# Patient Record
Sex: Female | Born: 1960
Health system: Southern US, Community
[De-identification: ages and names within clinical notes are randomized; demographics above are authoritative.]

## PROBLEM LIST (undated history)

## (undated) DIAGNOSIS — R42 Dizziness and giddiness: Secondary | ICD-10-CM

## (undated) DIAGNOSIS — G629 Polyneuropathy, unspecified: Secondary | ICD-10-CM

## (undated) DIAGNOSIS — E079 Disorder of thyroid, unspecified: Secondary | ICD-10-CM

## (undated) DIAGNOSIS — E119 Type 2 diabetes mellitus without complications: Secondary | ICD-10-CM

## (undated) HISTORY — PX: TONSILLECTOMY: SUR1361

## (undated) HISTORY — PX: TUBAL LIGATION: SHX77

## (undated) HISTORY — PX: THYROIDECTOMY: SHX17

## (undated) HISTORY — PX: CATARACT EXTRACTION: SUR2

---

## 2002-03-26 ENCOUNTER — Encounter: Payer: Self-pay | Admitting: Emergency Medicine

## 2002-03-26 ENCOUNTER — Emergency Department (HOSPITAL_COMMUNITY): Admission: EM | Admit: 2002-03-26 | Discharge: 2002-03-26 | Payer: Self-pay | Admitting: Emergency Medicine

## 2015-04-02 ENCOUNTER — Emergency Department (HOSPITAL_BASED_OUTPATIENT_CLINIC_OR_DEPARTMENT_OTHER)
Admission: EM | Admit: 2015-04-02 | Discharge: 2015-04-03 | Disposition: A | Discharge status: Home / Self Care | Payer: Self-pay | Attending: Emergency Medicine | Admitting: Emergency Medicine

## 2015-04-02 ENCOUNTER — Encounter (HOSPITAL_BASED_OUTPATIENT_CLINIC_OR_DEPARTMENT_OTHER): Payer: Self-pay | Admitting: *Deleted

## 2015-04-02 DIAGNOSIS — H81399 Other peripheral vertigo, unspecified ear: Secondary | ICD-10-CM | POA: Insufficient documentation

## 2015-04-02 DIAGNOSIS — R1030 Lower abdominal pain, unspecified: Secondary | ICD-10-CM | POA: Insufficient documentation

## 2015-04-02 DIAGNOSIS — E119 Type 2 diabetes mellitus without complications: Secondary | ICD-10-CM | POA: Insufficient documentation

## 2015-04-02 DIAGNOSIS — Z794 Long term (current) use of insulin: Secondary | ICD-10-CM | POA: Insufficient documentation

## 2015-04-02 HISTORY — DX: Type 2 diabetes mellitus without complications: E11.9

## 2015-04-02 HISTORY — DX: Disorder of thyroid, unspecified: E07.9

## 2015-04-02 LAB — COMPREHENSIVE METABOLIC PANEL
ALBUMIN: 4.2 g/dL (ref 3.5–5.0)
ALK PHOS: 51 U/L (ref 38–126)
ALT: 18 U/L (ref 14–54)
ANION GAP: 7 (ref 5–15)
AST: 19 U/L (ref 15–41)
BILIRUBIN TOTAL: 0.5 mg/dL (ref 0.3–1.2)
BUN: 15 mg/dL (ref 6–20)
CALCIUM: 9.5 mg/dL (ref 8.9–10.3)
CO2: 27 mmol/L (ref 22–32)
CREATININE: 0.75 mg/dL (ref 0.44–1.00)
Chloride: 105 mmol/L (ref 101–111)
GFR calc Af Amer: >60 mL/min (ref >60)
GFR calc non Af Amer: >60 mL/min (ref >60)
GLUCOSE: 307 mg/dL — AB (ref 65–99)
Potassium: 4.1 mmol/L (ref 3.5–5.1)
Sodium: 139 mmol/L (ref 135–145)
TOTAL PROTEIN: 7 g/dL (ref 6.5–8.1)

## 2015-04-02 LAB — CBC
HCT: 41.4 % (ref 36.0–46.0)
Hemoglobin: 13.6 g/dL (ref 12.0–15.0)
MCH: 25.7 pg — AB (ref 26.0–34.0)
MCHC: 32.9 g/dL (ref 30.0–36.0)
MCV: 78.3 fL (ref 78.0–100.0)
PLATELETS: 273 10*3/uL (ref 150–400)
RBC: 5.29 MIL/uL — ABNORMAL HIGH (ref 3.87–5.11)
RDW: 12.5 % (ref 11.5–15.5)
WBC: 8.4 10*3/uL (ref 4.0–10.5)

## 2015-04-02 LAB — DIFFERENTIAL
BASOS ABS: 0 10*3/uL (ref 0.0–0.1)
BASOS PCT: 0 %
EOS ABS: 0 10*3/uL (ref 0.0–0.7)
EOS PCT: 0 %
Lymphocytes Relative: 15 %
Lymphs Abs: 1.3 10*3/uL (ref 0.7–4.0)
Monocytes Absolute: 0.3 10*3/uL (ref 0.1–1.0)
Monocytes Relative: 3 %
NEUTROS PCT: 82 %
Neutro Abs: 6.6 10*3/uL (ref 1.7–7.7)

## 2015-04-02 LAB — CBG MONITORING, ED: Glucose-Capillary: 296 mg/dL — ABNORMAL HIGH (ref 65–99)

## 2015-04-02 LAB — LIPASE, BLOOD: Lipase: 49 U/L (ref 11–51)

## 2015-04-02 MED ORDER — SODIUM CHLORIDE 0.9 % IV BOLUS (SEPSIS)
1000.0000 mL | Freq: Once | INTRAVENOUS | Status: AC
  Administered 2015-04-02: 1000 mL via INTRAVENOUS

## 2015-04-02 MED ORDER — ONDANSETRON HCL 4 MG/2ML IJ SOLN
4.0000 mg | Freq: Once | INTRAMUSCULAR | Status: AC | PRN
  Administered 2015-04-02: 4 mg via INTRAVENOUS
  Filled 2015-04-02: qty 2

## 2015-04-02 MED ORDER — DIAZEPAM 5 MG/ML IJ SOLN
5.0000 mg | Freq: Once | INTRAMUSCULAR | Status: AC
  Administered 2015-04-02: 5 mg via INTRAVENOUS
  Filled 2015-04-02: qty 2

## 2015-04-02 NOTE — ED Notes (Signed)
[facility]

## 2015-04-02 NOTE — ED Notes (Signed)
Pt alert, NAD, calm, resting with eyes closed, guarding movements, c/o dizziness, HA and abd pain, (denies: sob or nausea), speech clear, no dyspnea noted, [facility]

## 2015-04-02 NOTE — ED Provider Notes (Addendum)
CSN: [id number]     Arrival date & time 04/02/15  2214 History  By signing my name below, I, Doreatha Martin, attest that this documentation has been prepared under the direction and in the presence of Paula Libra, MD. Electronically Signed: Doreatha Martin, ED Scribe. 04/02/2015. 11:01 PM.    Chief Complaint  Patient presents with  . Abdominal Pain   The history is provided by the patient. No language interpreter was used.    HPI Comments: Tracy Colon is a 54 y.o. female with h/o DM who presents to the Emergency Department complaining of moderate "room spinning" dizziness onset 2 hours ago. This is associated with nausea and vomiting. The nausea and vomiting led abdominal pain. She indicates her pain is in the lower abdomen and not worse with palpation or movement. She rates her pain as a 9 out of 10 at its worst but it has improved since arrival. She was given Zofran IV with improvement in her nausea. She denies diarrhea, tinnitus.   Past Medical History  Diagnosis Date  . Diabetes mellitus without complication (HCC)   . Thyroid disease    Past Surgical History  Procedure Laterality Date  . Tonsillectomy    . Tubal ligation    . Thyroidectomy     History reviewed. No pertinent family history. Social History  Substance Use Topics  . Smoking status: Never Smoker   . Smokeless tobacco: None  . Alcohol Use: No   OB History    No data available     Review of Systems  All other systems reviewed and are negative.  Allergies  Naprosyn  Home Medications   Prior to Admission medications   Medication Sig Start Date End Date Taking? Authorizing Provider  insulin aspart (NOVOLOG) 100 UNIT/ML injection Inject into the skin 3 (three) times daily before meals.   Yes Historical Provider, MD  insulin detemir (LEVEMIR) 100 UNIT/ML injection Inject into the skin at bedtime.   Yes Historical Provider, MD  diazepam (VALIUM) 5 MG tablet Take 1 tablet (5 mg total) by mouth every 8 (eight) hours  as needed (for vertigo). 04/03/15   Rafael Salway, MD  ondansetron (ZOFRAN ODT) 8 MG disintegrating tablet Take 1 tablet (8 mg total) by mouth every 8 (eight) hours as needed for nausea or vomiting. 04/03/15   Jalayah Gutridge, MD   BP 122/64 mmHg  Pulse 88  Temp(Src) 98.3 F (36.8 C) (Oral)  Resp 18  Ht  (1.753 m)  Wt 173 lb (78.472 kg)  BMI 25.54 kg/m2  SpO2 100%   Physical Exam General: Well-developed, well-nourished female in no acute distress; appearance consistent with age of record HENT: normocephalic; atraumatic Eyes: pupils equal, round and reactive to light; extraocular muscles intact; no nystagmus.  Neck: supple Heart: regular rate and rhythm; no murmurs, rubs or gallops Lungs: clear to auscultation bilaterally Abdomen: soft; nondistended; nontender; no masses or hepatosplenomegaly; bowel sounds present Extremities: No deformity; full range of motion; pulses normal Neurologic: Awake, alert and oriented; motor function intact in all extremities and symmetric; no facial droop Skin: Warm and dry Psychiatric: Normal mood and affect ED Course  Procedures (including critical care time) DIAGNOSTIC STUDIES: Oxygen Saturation is 100% on RA, normal by my interpretation.    COORDINATION OF CARE: 10:59 PM Discussed treatment plan with pt at bedside and pt agreed to plan.   Labs Review I have personally reviewed and evaluated these lab results as part of my medical decision-making.  MDM   Nursing notes and  vitals signs, including pulse oximetry, reviewed.  Summary of this visit's results, reviewed by myself:  Labs:  Results for orders placed or performed during the hospital encounter of 04/02/15 (from the past 24 hour(s))  POC CBG, ED     Status: Abnormal   Collection Time: 04/02/15 10:37 PM  Result Value Ref Range   Glucose-Capillary 296 (H) 65 - 99 mg/dL  Lipase, blood     Status: None   Collection Time: 04/02/15 10:40 PM  Result Value Ref Range   Lipase 49 11 - 51  U/L  Comprehensive metabolic panel     Status: Abnormal   Collection Time: 04/02/15 10:40 PM  Result Value Ref Range   Sodium 139 135 - 145 mmol/L   Potassium 4.1 3.5 - 5.1 mmol/L   Chloride 105 101 - 111 mmol/L   CO2 27 22 - 32 mmol/L   Glucose, Bld 307 (H) 65 - 99 mg/dL   BUN 15 6 - 20 mg/dL   Creatinine, Ser 4.090.75 0.44 - 1.00 mg/dL   Calcium 9.5 8.9 - 81.110.3 mg/dL   Total Protein 7.0 6.5 - 8.1 g/dL   Albumin 4.2 3.5 - 5.0 g/dL   AST 19 15 - 41 U/L   ALT 18 14 - 54 U/L   Alkaline Phosphatase 51 38 - 126 U/L   Total Bilirubin 0.5 0.3 - 1.2 mg/dL   GFR calc non Af Amer >60 >60 mL/min   GFR calc Af Amer >60 >60 mL/min   Anion gap 7 5 - 15  CBC     Status: Abnormal   Collection Time: 04/02/15 10:40 PM  Result Value Ref Range   WBC 8.4 4.0 - 10.5 K/uL   RBC 5.29 (H) 3.87 - 5.11 MIL/uL   Hemoglobin 13.6 12.0 - 15.0 g/dL   HCT 91.441.4 78.236.0 - 95.646.0 %   MCV 78.3 78.0 - 100.0 fL   MCH 25.7 (L) 26.0 - 34.0 pg   MCHC 32.9 30.0 - 36.0 g/dL   RDW 21.312.5 08.611.5 - 57.815.5 %   Platelets 273 150 - 400 K/uL  Differential     Status: None   Collection Time: 04/02/15 10:40 PM  Result Value Ref Range   Neutrophils Relative % 82 %   Neutro Abs 6.6 1.7 - 7.7 K/uL   Lymphocytes Relative 15 %   Lymphs Abs 1.3 0.7 - 4.0 K/uL   Monocytes Relative 3 %   Monocytes Absolute 0.3 0.1 - 1.0 K/uL   Eosinophils Relative 0 %   Eosinophils Absolute 0.0 0.0 - 0.7 K/uL   Basophils Relative 0 %   Basophils Absolute 0.0 0.0 - 0.1 K/uL  Urinalysis, Routine w reflex microscopic ([facility])     Status: Abnormal   Collection Time: 04/03/15 12:00 AM  Result Value Ref Range   Color, Urine YELLOW YELLOW   APPearance CLOUDY (A) CLEAR   Specific Gravity, Urine 1.038 (H) 1.005 - 1.030   pH 7.5 5.0 - 8.0   Glucose, UA >1000 (A) NEGATIVE mg/dL   Hgb urine dipstick NEGATIVE NEGATIVE   Bilirubin Urine NEGATIVE NEGATIVE   Ketones, ur 40 (A) NEGATIVE mg/dL   Protein, ur NEGATIVE NEGATIVE mg/dL   Urobilinogen, UA 0.2 0.0 - 1.0  mg/dL   Nitrite NEGATIVE NEGATIVE   Leukocytes, UA NEGATIVE NEGATIVE  Urine microscopic-add on     Status: None   Collection Time: 04/03/15 12:00 AM  Result Value Ref Range   Squamous Epithelial / LPF RARE RARE   WBC, UA  0-2 <3 WBC/hpf   RBC / HPF 0-2 <3 RBC/hpf   Bacteria, UA RARE RARE   12:18 AM Vertigo significantly improved after Valium IV.  I personally performed the services described in this documentation, which was scribed in my presence. The recorded information has been reviewed and is accurate.   Paula Libra, MD 04/03/15 1610  Paula Libra, MD 04/03/15 9604

## 2015-04-02 NOTE — ED Notes (Signed)
Abdominal pain. Dizziness. Vomiting. Symptoms started 2 hours ago.

## 2015-04-03 LAB — URINALYSIS, ROUTINE W REFLEX MICROSCOPIC
Bilirubin Urine: NEGATIVE
Glucose, UA: >1000 mg/dL — AB
Hgb urine dipstick: NEGATIVE
Ketones, ur: 40 mg/dL — AB
Leukocytes, UA: NEGATIVE
Nitrite: NEGATIVE
Protein, ur: NEGATIVE mg/dL
Specific Gravity, Urine: 1.038 — ABNORMAL HIGH (ref 1.005–1.030)
Urobilinogen, UA: 0.2 mg/dL (ref 0.0–1.0)
pH: 7.5 (ref 5.0–8.0)

## 2015-04-03 LAB — URINE MICROSCOPIC-ADD ON

## 2015-04-03 MED ORDER — ONDANSETRON 8 MG PO TBDP: 8.0000 mg | ORAL_TABLET | Freq: Three times a day (TID) | ORAL | Status: DC | PRN

## 2015-04-03 MED ORDER — DIAZEPAM 5 MG PO TABS: 5.0000 mg | ORAL_TABLET | Freq: Three times a day (TID) | ORAL | Status: DC | PRN

## 2015-04-03 NOTE — ED Notes (Signed)
Denies questions or needs, VSS, alert, NAD, calm, interactive, speech clear, (denies pain, nausea or dizziness), "ready to go, feel better", given Rx x2, out in w/c with family x2, self transferred to w/c.

## 2015-09-03 ENCOUNTER — Encounter (HOSPITAL_BASED_OUTPATIENT_CLINIC_OR_DEPARTMENT_OTHER): Payer: Self-pay | Admitting: *Deleted

## 2015-09-03 ENCOUNTER — Emergency Department (HOSPITAL_BASED_OUTPATIENT_CLINIC_OR_DEPARTMENT_OTHER)
Admission: EM | Admit: 2015-09-03 | Discharge: 2015-09-03 | Disposition: A | Discharge status: Home / Self Care | Payer: Self-pay | Attending: Emergency Medicine | Admitting: Emergency Medicine

## 2015-09-03 ENCOUNTER — Emergency Department (HOSPITAL_BASED_OUTPATIENT_CLINIC_OR_DEPARTMENT_OTHER): Payer: Self-pay

## 2015-09-03 DIAGNOSIS — M79642 Pain in left hand: Secondary | ICD-10-CM | POA: Insufficient documentation

## 2015-09-03 DIAGNOSIS — M6281 Muscle weakness (generalized): Secondary | ICD-10-CM | POA: Insufficient documentation

## 2015-09-03 DIAGNOSIS — Z794 Long term (current) use of insulin: Secondary | ICD-10-CM | POA: Insufficient documentation

## 2015-09-03 DIAGNOSIS — R600 Localized edema: Secondary | ICD-10-CM

## 2015-09-03 DIAGNOSIS — E1065 Type 1 diabetes mellitus with hyperglycemia: Secondary | ICD-10-CM | POA: Insufficient documentation

## 2015-09-03 DIAGNOSIS — M79641 Pain in right hand: Secondary | ICD-10-CM

## 2015-09-03 DIAGNOSIS — R739 Hyperglycemia, unspecified: Secondary | ICD-10-CM

## 2015-09-03 HISTORY — DX: Dizziness and giddiness: R42

## 2015-09-03 LAB — BASIC METABOLIC PANEL
Anion gap: 7 (ref 5–15)
BUN: 11 mg/dL (ref 6–20)
CO2: 29 mmol/L (ref 22–32)
Calcium: 9.4 mg/dL (ref 8.9–10.3)
Chloride: 103 mmol/L (ref 101–111)
Creatinine, Ser: 0.65 mg/dL (ref 0.44–1.00)
GFR calc Af Amer: >60 mL/min (ref >60)
GLUCOSE: 265 mg/dL — AB (ref 65–99)
POTASSIUM: 4.1 mmol/L (ref 3.5–5.1)
Sodium: 139 mmol/L (ref 135–145)

## 2015-09-03 LAB — CBC WITH DIFFERENTIAL/PLATELET
BASOS ABS: 0 10*3/uL (ref 0.0–0.1)
Basophils Relative: 0 %
EOS PCT: 1 %
Eosinophils Absolute: 0.1 10*3/uL (ref 0.0–0.7)
HEMATOCRIT: 37.9 % (ref 36.0–46.0)
Hemoglobin: 12.2 g/dL (ref 12.0–15.0)
LYMPHS ABS: 1.9 10*3/uL (ref 0.7–4.0)
LYMPHS PCT: 30 %
MCH: 25.7 pg — AB (ref 26.0–34.0)
MCHC: 32.2 g/dL (ref 30.0–36.0)
MCV: 80 fL (ref 78.0–100.0)
MONO ABS: 0.4 10*3/uL (ref 0.1–1.0)
MONOS PCT: 6 %
Neutro Abs: 4 10*3/uL (ref 1.7–7.7)
Neutrophils Relative %: 63 %
PLATELETS: 245 10*3/uL (ref 150–400)
RBC: 4.74 MIL/uL (ref 3.87–5.11)
RDW: 12.8 % (ref 11.5–15.5)
WBC: 6.4 10*3/uL (ref 4.0–10.5)

## 2015-09-03 LAB — CBG MONITORING, ED: GLUCOSE-CAPILLARY: 242 mg/dL — AB (ref 65–99)

## 2015-09-03 MED ORDER — HYDROCHLOROTHIAZIDE 25 MG PO TABS: 25.0000 mg | ORAL_TABLET | Freq: Every day | ORAL | Status: DC

## 2015-09-03 MED FILL — HYDROCHLOROTHIAZIDE 25 MG T: 25 | 15 days supply | Qty: 15 | Fill #0

## 2015-09-03 NOTE — ED Notes (Signed)
C/o pain in hands and feet "for a while" which is worse over the last 2-3 weeks. Denies injury. Also c/o swelling in ankles and weakness in both hands

## 2015-09-03 NOTE — ED Provider Notes (Signed)
CSN: [id number]     Arrival date & time 09/03/15  1023 History   First MD Initiated Contact with Patient 09/03/15 1031     Chief Complaint  Patient presents with  . Hand Pain     (Consider location/radiation/quality/duration/timing/severity/associated sxs/prior Treatment) HPI Comments: Patient is a 55 year old female with history of type 1 diabetes. She presents for evaluation of discomfort in her feet and hands that has been going on for several weeks. She denies any injury or trauma. She reports some weakness when she attempts to open jars or perform other tasks with her hands. She also reports some discomfort in her feet and feels as though her ankles may be slightly swollen.  Patient is a 55 y.o. female presenting with hand pain. The history is provided by the patient.  Hand Pain This is a new problem. The problem occurs constantly. The problem has been gradually worsening. Pertinent negatives include no chest pain and no shortness of breath. Nothing aggravates the symptoms. Nothing relieves the symptoms.    Past Medical History  Diagnosis Date  . Diabetes mellitus without complication (HCC)   . Thyroid disease   . Vertigo    Past Surgical History  Procedure Laterality Date  . Tonsillectomy    . Tubal ligation    . Thyroidectomy     No family history on file. Social History  Substance Use Topics  . Smoking status: Never Smoker   . Smokeless tobacco: Never Used  . Alcohol Use: No   OB History    No data available     Review of Systems  Respiratory: Negative for shortness of breath.   Cardiovascular: Negative for chest pain.  All other systems reviewed and are negative.     Allergies  Naprosyn  Home Medications   Prior to Admission medications   Medication Sig Start Date End Date Taking? Authorizing Provider  insulin aspart (NOVOLOG) 100 UNIT/ML injection Inject into the skin 3 (three) times daily before meals.   Yes Historical Provider, MD  insulin detemir  (LEVEMIR) 100 UNIT/ML injection Inject 40 Units into the skin daily.    Yes Historical Provider, MD  diazepam (VALIUM) 5 MG tablet Take 1 tablet (5 mg total) by mouth every 8 (eight) hours as needed (for vertigo). 04/03/15   John Molpus, MD  ondansetron (ZOFRAN ODT) 8 MG disintegrating tablet Take 1 tablet (8 mg total) by mouth every 8 (eight) hours as needed for nausea or vomiting. 04/03/15   John Molpus, MD   BP 137/85 mmHg  Pulse 96  Temp(Src) 98.6 F (37 C) (Oral)  Resp 20  Ht  (1.778 m)  Wt 175 lb (79.379 kg)  BMI 25.11 kg/m2  SpO2 99% Physical Exam  Constitutional: She is oriented to person, place, and time. She appears well-developed and well-nourished. No distress.  HENT:  Head: Normocephalic and atraumatic.  Neck: Normal range of motion. Neck supple.  Cardiovascular: Normal rate and regular rhythm.  Exam reveals no gallop and no friction rub.   No murmur heard. Pulmonary/Chest: Effort normal and breath sounds normal. No respiratory distress. She has no wheezes.  Abdominal: Soft. Bowel sounds are normal. She exhibits no distension. There is no tenderness.  Musculoskeletal: Normal range of motion. She exhibits edema.  There is trace edema of the bilateral lower extremities.  Both hands and feet appear grossly normal. There is no swelling, warmth, or discoloration.  Neurological: She is alert and oriented to person, place, and time.  Skin: Skin is warm and  dry. She is not diaphoretic.  Nursing note and vitals reviewed.   ED Course  Procedures (including critical care time) Labs Review Labs Reviewed  CBG MONITORING, ED - Abnormal; Notable for the following:    Glucose-Capillary 242 (*)    All other components within normal limits  BASIC METABOLIC PANEL  CBC WITH DIFFERENTIAL/PLATELET    Imaging Review No results found. I have personally reviewed and evaluated these images and lab results as part of my medical decision-making.   EKG Interpretation None       MDM   Final diagnoses:  None    Patient is a 55 year old female with history of diabetes who presents with complaints of bilateral hand and foot pain. Her sugars appear to be poorly controlled and I suspect the symptoms may be related to the development of a peripheral neuropathy. She does have trace edema of her ankles which will be treated with hydrochlorothiazide. I will also adjust her insulin dose upward somewhat and have her watch her carbohydrate/sugar intake. Patient describes difficulty getting in to see her doctor for financial reasons.    Geoffery Lyonsouglas La Dibella, MD 09/03/15 1230

## 2015-09-03 NOTE — Discharge Instructions (Signed)
Increase your Levemir to 45 units subcutaneously once daily.  Keep a record of your blood sugars which you can take to your primary Dr. at your next appointment.   Hyperglycemia Hyperglycemia occurs when the glucose (sugar) in your blood is too high. Hyperglycemia can happen for many reasons, but it most often happens to people who do not know they have diabetes or are not managing their diabetes properly.  CAUSES  Whether you have diabetes or not, there are other causes of hyperglycemia. Hyperglycemia can occur when you have diabetes, but it can also occur in other situations that you might not be as aware of, such as: Diabetes  If you have diabetes and are having problems controlling your blood glucose, hyperglycemia could occur because of some of the following reasons:  Not following your meal plan.  Not taking your diabetes medications or not taking it properly.  Exercising less or doing less activity than you normally do.  Being sick. Pre-diabetes  This cannot be ignored. Before people develop Type 2 diabetes, they almost always have "pre-diabetes." This is when your blood glucose levels are higher than normal, but not yet high enough to be diagnosed as diabetes. Research has shown that some long-term damage to the body, especially the heart and circulatory system, may already be occurring during pre-diabetes. If you take action to manage your blood glucose when you have pre-diabetes, you may delay or prevent Type 2 diabetes from developing. Stress  If you have diabetes, you may be "diet" controlled or on oral medications or insulin to control your diabetes. However, you may find that your blood glucose is higher than usual in the hospital whether you have diabetes or not. This is often referred to as "stress hyperglycemia." Stress can elevate your blood glucose. This happens because of hormones put out by the body during times of stress. If stress has been the cause of your high blood  glucose, it can be followed regularly by your caregiver. That way he/she can make sure your hyperglycemia does not continue to get worse or progress to diabetes. Steroids  Steroids are medications that act on the infection fighting system (immune system) to block inflammation or infection. One side effect can be a rise in blood glucose. Most people can produce enough extra insulin to allow for this rise, but for those who cannot, steroids make blood glucose levels go even higher. It is not unusual for steroid treatments to "uncover" diabetes that is developing. It is not always possible to determine if the hyperglycemia will go away after the steroids are stopped. A special blood test called an A1c is sometimes done to determine if your blood glucose was elevated before the steroids were started. SYMPTOMS  Thirsty.  Frequent urination.  Dry mouth.  Blurred vision.  Tired or fatigue.  Weakness.  Sleepy.  Tingling in feet or leg. DIAGNOSIS  Diagnosis is made by monitoring blood glucose in one or all of the following ways:  A1c test. This is a chemical found in your blood.  Fingerstick blood glucose monitoring.  Laboratory results. TREATMENT  First, knowing the cause of the hyperglycemia is important before the hyperglycemia can be treated. Treatment may include, but is not be limited to:  Education.  Change or adjustment in medications.  Change or adjustment in meal plan.  Treatment for an illness, infection, etc.  More frequent blood glucose monitoring.  Change in exercise plan.  Decreasing or stopping steroids.  Lifestyle changes. HOME CARE INSTRUCTIONS   Test  your blood glucose as directed.  Exercise regularly. Your caregiver will give you instructions about exercise. Pre-diabetes or diabetes which comes on with stress is helped by exercising.  Eat wholesome, balanced meals. Eat often and at regular, fixed times. Your caregiver or nutritionist will give you a  meal plan to guide your sugar intake.  Being at an ideal weight is important. If needed, losing as little as 10 to 15 pounds may help improve blood glucose levels. SEEK MEDICAL CARE IF:   You have questions about medicine, activity, or diet.  You continue to have symptoms (problems such as increased thirst, urination, or weight gain). SEEK IMMEDIATE MEDICAL CARE IF:   You are vomiting or have diarrhea.  Your breath smells fruity.  You are breathing faster or slower.  You are very sleepy or incoherent.  You have numbness, tingling, or pain in your feet or hands.  You have chest pain.  Your symptoms get worse even though you have been following your caregiver's orders.  If you have any other questions or concerns.   This information is not intended to replace advice given to you by your health care provider. Make sure you discuss any questions you have with your health care provider.   Document Released: 11/22/2000 Document Revised: 08/21/2011 Document Reviewed: 02/02/2015 Elsevier Interactive Patient Education [employer]

## 2016-07-12 ENCOUNTER — Emergency Department (HOSPITAL_BASED_OUTPATIENT_CLINIC_OR_DEPARTMENT_OTHER)
Admission: EM | Admit: 2016-07-12 | Discharge: 2016-07-12 | Disposition: A | Discharge status: Home / Self Care | Payer: Self-pay | Attending: Emergency Medicine | Admitting: Emergency Medicine

## 2016-07-12 ENCOUNTER — Encounter (HOSPITAL_BASED_OUTPATIENT_CLINIC_OR_DEPARTMENT_OTHER): Payer: Self-pay

## 2016-07-12 DIAGNOSIS — M79671 Pain in right foot: Secondary | ICD-10-CM

## 2016-07-12 DIAGNOSIS — E119 Type 2 diabetes mellitus without complications: Secondary | ICD-10-CM | POA: Insufficient documentation

## 2016-07-12 DIAGNOSIS — B351 Tinea unguium: Secondary | ICD-10-CM | POA: Insufficient documentation

## 2016-07-12 DIAGNOSIS — M79672 Pain in left foot: Secondary | ICD-10-CM

## 2016-07-12 DIAGNOSIS — M79642 Pain in left hand: Secondary | ICD-10-CM

## 2016-07-12 DIAGNOSIS — Z794 Long term (current) use of insulin: Secondary | ICD-10-CM | POA: Insufficient documentation

## 2016-07-12 DIAGNOSIS — M79641 Pain in right hand: Secondary | ICD-10-CM

## 2016-07-12 LAB — CBG MONITORING, ED: Glucose-Capillary: 243 mg/dL — ABNORMAL HIGH (ref 65–99)

## 2016-07-12 MED ORDER — TERBINAFINE HCL 1 % EX CREA: 1.0000 "application " | TOPICAL_CREAM | Freq: Two times a day (BID) | CUTANEOUS | 0 refills | Status: AC

## 2016-07-12 MED ORDER — PREDNISONE 20 MG PO TABS: ORAL_TABLET | ORAL | 0 refills | Status: DC

## 2016-07-12 MED ORDER — TERBINAFINE HCL 1 % EX CREA
TOPICAL_CREAM | Freq: Every day | CUTANEOUS | Status: DC
  Filled 2016-07-12: qty 12

## 2016-07-12 MED FILL — predniSONE 20 MG TABS: 20 | 9 days supply | Qty: 12 | Fill #0

## 2016-07-12 NOTE — Discharge Instructions (Signed)
Take the prescribed medication as directed.  You will need to watch your sugar closely while taking prednisone because it can cause it to go up.  Watch your sugar and carb intake as well. Follow-up with your primary care doctor. Return to the ED for new or worsening symptoms.

## 2016-07-12 NOTE — ED Triage Notes (Addendum)
C/o pain to both hand and both feet x 1 month-right great toenail has turned black x 1 month-NAD-steady gait-states endocrinologist started her on gabapentin for possible neuropathy last year-stopped taking med because it did not relieve pain

## 2016-07-12 NOTE — ED Provider Notes (Signed)
[facility] Provider Note   CSN: [id number] Arrival date & time: 07/12/16  1446     History   Chief Complaint Chief Complaint  Patient presents with  . Hand Pain  . Foot Pain    HPI Tracy Colon is a 56 y.o. female.  The history is provided by the patient and medical records.    56 year old female with history of diabetes, thyroid disease, presenting to the ED for bilateral hand and foot pain. She reports this is been ongoing for several months. Her endocrinologist seems to think she has peripheral neuropathy. She tried taking gabapentin, however it made her too drowsy. States she has had some relief with prednisone in the past.  States pain is mostly aching.  She denies any numbness or weakness. No fever or chills. No redness or warmth of the joints.  Did not check her CBG today.  Patient also complains of discolored toenails. States the worst seems to be her right great toe, however other toenails of started turning red/black in color. States she has noticed some white crusting beneath her nails so she tried to clean this out in the shower.  Was concerned this was fungal but was unsure so wanted it checked.  Past Medical History:  Diagnosis Date  . Diabetes mellitus without complication (HCC)   . Thyroid disease   . Vertigo     There are no active problems to display for this patient.   Past Surgical History:  Procedure Laterality Date  . THYROIDECTOMY    . TONSILLECTOMY    . TUBAL LIGATION      OB History    No data available       Home Medications    Prior to Admission medications   Medication Sig Start Date End Date Taking? Authorizing Provider  insulin aspart (NOVOLOG) 100 UNIT/ML injection Inject into the skin 3 (three) times daily before meals.    Historical Provider, MD  insulin detemir (LEVEMIR) 100 UNIT/ML injection Inject 40 Units into the skin daily.     Historical Provider, MD    Family History No family history on file.  Social  History Social History  Substance Use Topics  . Smoking status: Never Smoker  . Smokeless tobacco: Never Used  . Alcohol use No     Allergies   Naprosyn [naproxen]   Review of Systems Review of Systems  Musculoskeletal: Positive for arthralgias.  All other systems reviewed and are negative.    Physical Exam Updated Vital Signs BP 157/96 (BP Location: Left Arm)   Pulse 83   Temp 97.5 F (36.4 C) (Oral)   Resp 18   SpO2 100%   Physical Exam  Constitutional: She is oriented to person, place, and time. She appears well-developed and well-nourished.  HENT:  Head: Normocephalic and atraumatic.  Mouth/Throat: Oropharynx is clear and moist.  Eyes: Conjunctivae and EOM are normal. Pupils are equal, round, and reactive to light.  Neck: Normal range of motion.  Cardiovascular: Normal rate, regular rhythm and normal heart sounds.   Pulmonary/Chest: Effort normal and breath sounds normal.  Abdominal: Soft. Bowel sounds are normal.  Musculoskeletal: Normal range of motion.  Joints of hands and feet normal in appearance without significant swelling, discoloration, or warmth to touch; does have evidence of trigger finger of right 4th digit (known) Discoloration of nearly all the toenails, worse of the right great toe and right little toe; there is evidence of fungal infection present; color changes are consistent with onychomycosis  Neurological:  She is alert and oriented to person, place, and time.  Skin: Skin is warm and dry.  Psychiatric: She has a normal mood and affect.  Nursing note and vitals reviewed.    ED Treatments / Results  Labs (all labs ordered are listed, but only abnormal results are displayed) Labs Reviewed  CBG MONITORING, ED    EKG  EKG Interpretation None       Radiology No results found.  Procedures Procedures (including critical care time)  Medications Ordered in ED Medications - No data to display   Initial Impression / Assessment and  Plan / ED Course  I have reviewed the triage vital signs and the nursing notes.  Pertinent labs & imaging results that were available during my care of the patient were reviewed by me and considered in my medical decision making (see chart for details).  56 year old female here with aching pain of her hands and feet. Has been seen here for previous past. She does have poorly controlled diabetes. There is no swelling, redness, discoloration, or warmth to touch of her joints. I do not suspect septic joint, acute fx, or dislocation.  She very likely does have some peripheral neuropathy which is causing her symptoms.  States has responded well to prednisone in the past.  Will prescribe short taper of this as well as some pain medication.  Patient will need to monitor sugar closely while taking prednisone.  CBG here 247.  Patient was also complaining of discolored toenails. Appears to have fungal infection present of the nails and color changes are consistent with onychomycosis.  Will start Lamisil cream for this.    Encouraged to follow-up closely with her PCP.  Discussed plan with patient, she acknowledged understanding and agreed with plan of care.  Return precautions given for new or worsening symptoms.  Final Clinical Impressions(s) / ED Diagnoses   Final diagnoses:  Pain in both hands  Foot pain, bilateral  Onychomycosis    New Prescriptions New Prescriptions   PREDNISONE (DELTASONE) 20 MG TABLET    Take 40 mg by mouth daily for 3 days, then 20mg  by mouth daily for 3 days, then 10mg  daily for 3 days   TERBINAFINE (LAMISIL AT) 1 % CREAM    Apply 1 application topically 2 (two) times daily.     Garlon Hatchet, PA-C 07/12/16 1610    Benjiman Core, MD 07/12/16 2329

## 2016-10-30 ENCOUNTER — Emergency Department (HOSPITAL_BASED_OUTPATIENT_CLINIC_OR_DEPARTMENT_OTHER)
Admission: EM | Admit: 2016-10-30 | Discharge: 2016-10-30 | Disposition: A | Discharge status: Home / Self Care | Payer: Self-pay | Attending: Physician Assistant | Admitting: Physician Assistant

## 2016-10-30 ENCOUNTER — Encounter (HOSPITAL_BASED_OUTPATIENT_CLINIC_OR_DEPARTMENT_OTHER): Payer: Self-pay | Admitting: Emergency Medicine

## 2016-10-30 DIAGNOSIS — S0592XA Unspecified injury of left eye and orbit, initial encounter: Secondary | ICD-10-CM

## 2016-10-30 DIAGNOSIS — W228XXA Striking against or struck by other objects, initial encounter: Secondary | ICD-10-CM | POA: Insufficient documentation

## 2016-10-30 DIAGNOSIS — Z794 Long term (current) use of insulin: Secondary | ICD-10-CM | POA: Insufficient documentation

## 2016-10-30 DIAGNOSIS — Y929 Unspecified place or not applicable: Secondary | ICD-10-CM | POA: Insufficient documentation

## 2016-10-30 DIAGNOSIS — Z79899 Other long term (current) drug therapy: Secondary | ICD-10-CM | POA: Insufficient documentation

## 2016-10-30 DIAGNOSIS — Y999 Unspecified external cause status: Secondary | ICD-10-CM | POA: Insufficient documentation

## 2016-10-30 DIAGNOSIS — S0502XA Injury of conjunctiva and corneal abrasion without foreign body, left eye, initial encounter: Secondary | ICD-10-CM | POA: Insufficient documentation

## 2016-10-30 DIAGNOSIS — Y939 Activity, unspecified: Secondary | ICD-10-CM | POA: Insufficient documentation

## 2016-10-30 DIAGNOSIS — E119 Type 2 diabetes mellitus without complications: Secondary | ICD-10-CM | POA: Insufficient documentation

## 2016-10-30 MED ORDER — FLUORESCEIN SODIUM 0.6 MG OP STRP
1.0000 | ORAL_STRIP | Freq: Once | OPHTHALMIC | Status: AC
  Administered 2016-10-30: 1 via OPHTHALMIC
  Filled 2016-10-30: qty 1

## 2016-10-30 MED ORDER — ERYTHROMYCIN 5 MG/GM OP OINT
TOPICAL_OINTMENT | Freq: Once | OPHTHALMIC | Status: AC
  Administered 2016-10-30: 1 via OPHTHALMIC
  Filled 2016-10-30: qty 3.5

## 2016-10-30 MED ORDER — TETRACAINE HCL 0.5 % OP SOLN
1.0000 [drp] | Freq: Once | OPHTHALMIC | Status: AC
  Administered 2016-10-30: 1 [drp] via OPHTHALMIC
  Filled 2016-10-30: qty 4

## 2016-10-30 NOTE — Discharge Instructions (Signed)
We will use antibiotic ointment every 4 hours. Please follow-up in the morning with ophthalmology. You should call first thing in the morning to try to get an appointment.

## 2016-10-30 NOTE — ED Provider Notes (Addendum)
[facility] Provider Note   CSN: [id number] Arrival date & time: 10/30/16  1629  By signing my name below, I, Tracy Colon, attest that this documentation has been prepared under the direction and in the presence of physician practitioner, Corlis LeakMackuen, Cindee Saltourteney Lyn, MD. Electronically Signed: Linna Darnerussell Colon, Scribe. 10/30/2016. 6:07 PM.  History   Chief Complaint Chief Complaint  Patient presents with  . Eye Injury   The history is provided by the patient. No language interpreter was used.    HPI Comments: Tracy Colon is a 56 y.o. female with PMHx including DM who presents to the Emergency Department for evaluation of a left eye injury sustained four days ago. She states she inadvertently "poked" her left eye with a broomstick handle. Patient endorses gradually worsening pain and redness to her left eye as well as some photophobia bilaterally. She reports a "sandpaper sensation" in her left eye with movement of the eye. No alleviating factors noted. Patient denies any foreign bodies in her left eye, vision changes, nausea, vomiting, or any other associated symptoms. She is followed by an eye specialist.  Past Medical History:  Diagnosis Date  . Diabetes mellitus without complication (HCC)   . Thyroid disease   . Vertigo     There are no active problems to display for this patient.   Past Surgical History:  Procedure Laterality Date  . THYROIDECTOMY    . TONSILLECTOMY    . TUBAL LIGATION      OB History    No data available       Home Medications    Prior to Admission medications   Medication Sig Start Date End Date Taking? Authorizing Provider  insulin aspart (NOVOLOG) 100 UNIT/ML injection Inject into the skin 3 (three) times daily before meals.   Yes [provider]  insulin detemir (LEVEMIR) 100 UNIT/ML injection Inject 40 Units into the skin daily.    Yes [provider]  predniSONE (DELTASONE) 20 MG tablet Take 40 mg by mouth daily  for 3 days, then 20mg  by mouth daily for 3 days, then 10mg  daily for 3 days 07/12/16   Garlon HatchetSanders, Lisa M, PA-C  terbinafine (LAMISIL AT) 1 % cream Apply 1 application topically 2 (two) times daily. 07/12/16   Garlon HatchetSanders, Lisa M, PA-C    Family History No family history on file.  Social History Social History  Substance Use Topics  . Smoking status: Never Smoker  . Smokeless tobacco: Never Used  . Alcohol use No     Allergies   Naprosyn [naproxen]   Review of Systems Review of Systems  Eyes: Positive for photophobia, pain (L) and redness (L). Negative for visual disturbance.  Gastrointestinal: Negative for nausea and vomiting.   Physical Exam Updated Vital Signs BP 134/77 (BP Location: Right Arm)   Pulse 99   Temp 99.7 F (37.6 C) (Oral)   Resp 18   Ht 5\' 9"  (1.753 m)   Wt 175 lb (79.4 kg)   SpO2 98%   BMI 25.84 kg/m   Physical Exam  Constitutional: She is oriented to person, place, and time. She appears well-developed and well-nourished. No distress.  HENT:  Head: Normocephalic and atraumatic.  Eyes: EOM are normal. Pupils are equal, round, and reactive to light. Left conjunctiva is injected.  Pain with light in either pupil.  Neck: Neck supple. No tracheal deviation present.  Cardiovascular: Normal rate.   Pulmonary/Chest: Effort normal. No respiratory distress.  Musculoskeletal: Normal range of motion.  Neurological: She is alert  and oriented to person, place, and time.  Skin: Skin is warm and dry.  Psychiatric: She has a normal mood and affect. Her behavior is normal.  Nursing note and vitals reviewed.  ED Treatments / Results  Labs (all labs ordered are listed, but only abnormal results are displayed) Labs Reviewed - No data to display  EKG  EKG Interpretation None       Radiology No results found.  Procedures Procedures (including critical care time)  DIAGNOSTIC STUDIES: Oxygen Saturation is 98% on RA, normal by my interpretation.     COORDINATION OF CARE: 6:05 PM Discussed treatment plan with pt at bedside and pt agreed to plan.  Medications Ordered in ED Medications - No data to display   Initial Impression / Assessment and Plan / ED Course  I have reviewed the triage vital signs and the nursing notes.  Pertinent labs & imaging results that were available during my care of the patient were reviewed by me and considered in my medical decision making (see chart for details).     I personally performed the services described in this documentation, which was scribed in my presence. The recorded information has been reviewed and is accurate.   Well-appearing 56 year old female presenting with  injury to left eye. Patient reports having 4-5 days ago. She said was mildly pain ful at first but has been getting worse. Patient has an ophthalmologist but has not been following up with him recently. Patient has pain to light in her eye. As well as conjunctival irritation. Concern about her traumatic iritis. Woods lamp shows no evidence of corneal abrasion or ulcer. We'll treat with erythro ointment until follow-up with ophthalmology tomorrow. Patient reports she can follow ophthalmologist in the morning.  7:20 PM Discussed with patient and with ophthalmology. 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 Tono-Pen not working correctly but given that this started after traumatic injury, acute angle glaucoma less likely.. Will have follow-up with ophthalmology in one to 4 days.  Final Clinical Impressions(s) / ED Diagnoses   Final diagnoses:  None    New Prescriptions New Prescriptions   No medications on file   I personally performed the services described in this documentation, which was scribed in my presence. The recorded information has been reviewed and is accurate.      Abelino Derrick, MD 10/30/16 1829    Abelino Derrick, MD 10/30/16 1610

## 2016-10-30 NOTE — ED Triage Notes (Signed)
Pt poked her L eye with the broom handle 3 days ago. Reports ongoing pain and light sensitivity.

## 2016-10-30 NOTE — ED Notes (Signed)
Pt verbalizes understanding of d/c instructions and denies any further needs at this time. 

## 2017-02-09 IMAGING — CR DG HAND COMPLETE 3+V*R*
3 series · 3 of 3 positions shown · non-contrast
Comparison: None.

CLINICAL DATA: Pain and numbness for 2 weeks.  No history of trauma

EXAM:
RIGHT HAND - COMPLETE 3+ VIEW

[x hand pa right]
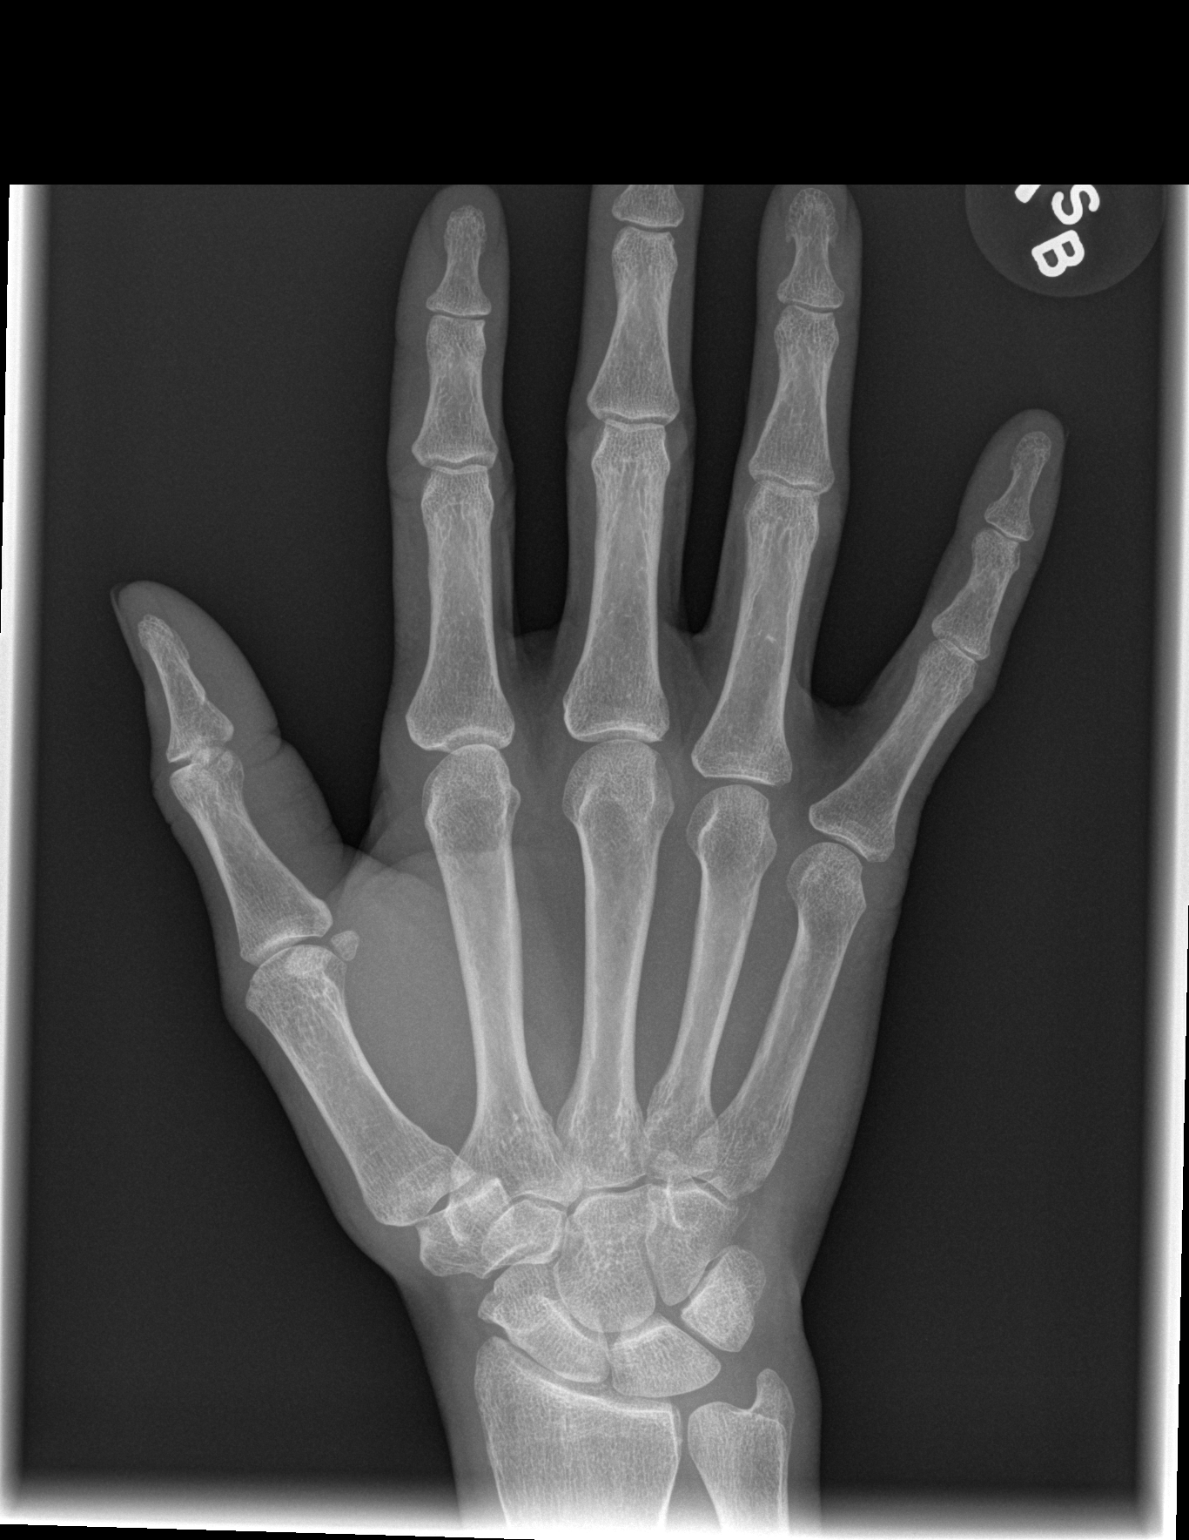

[x hand oblique right]
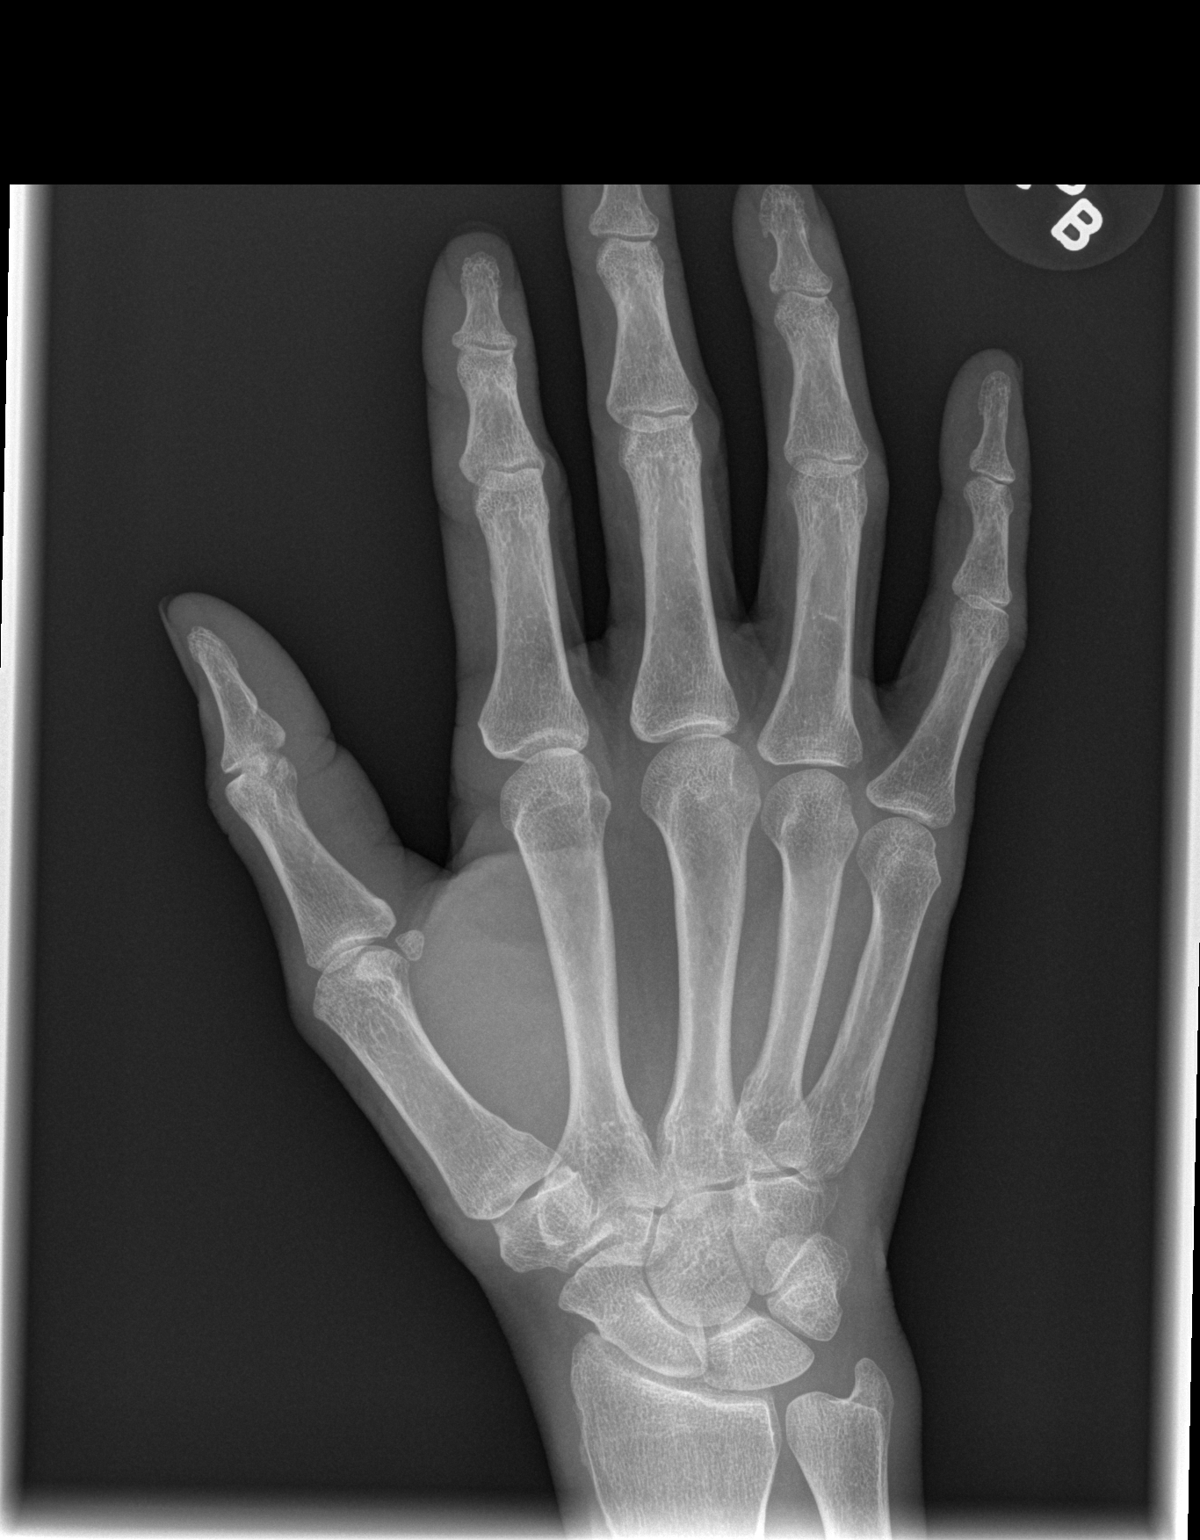

[x hand lat right]
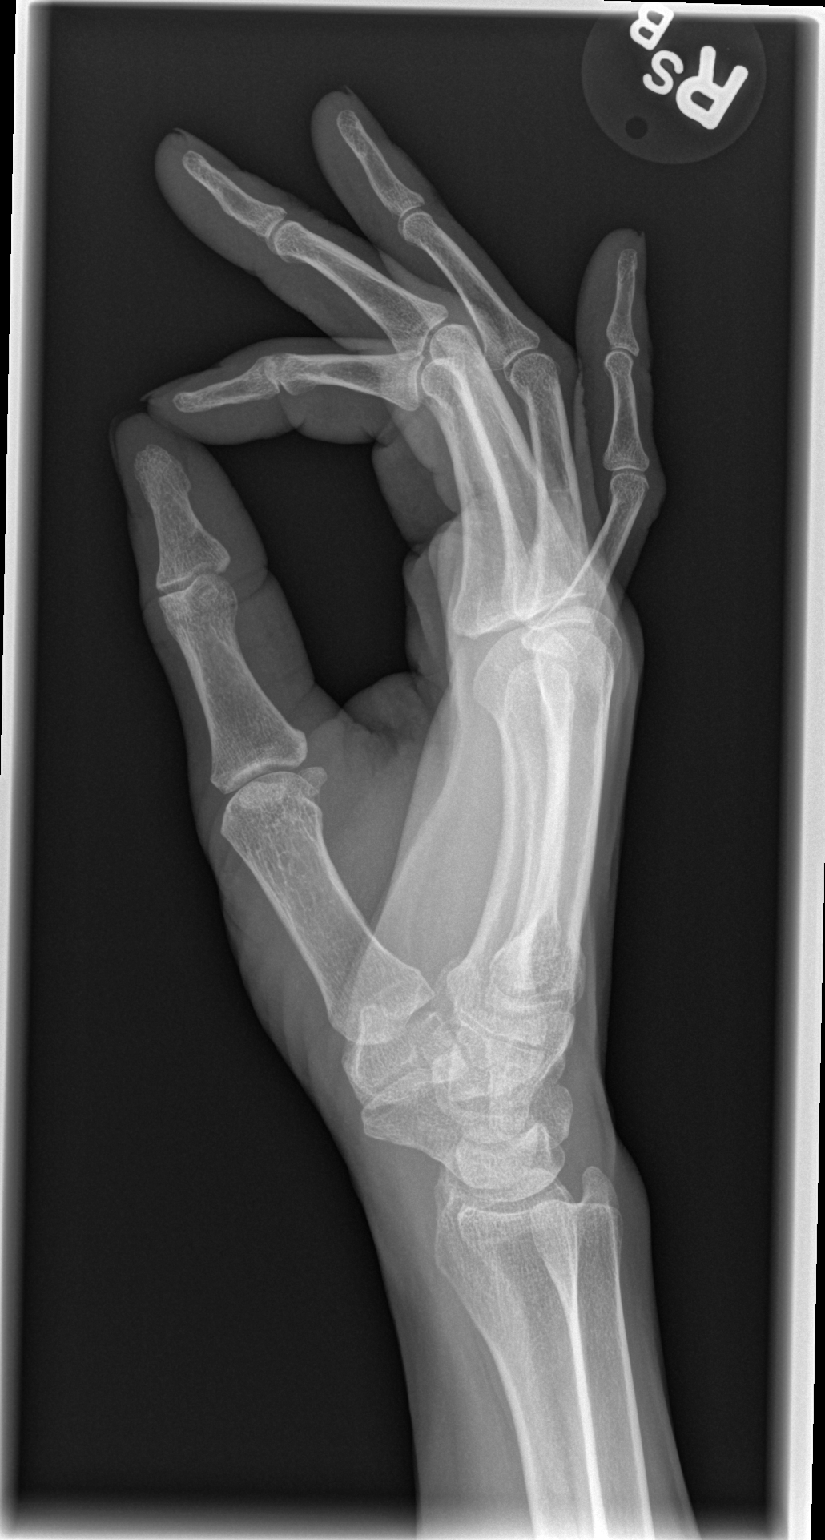

[3 of 3 positions shown; findings below may reference images not displayed]

FINDINGS: Frontal, oblique, and lateral views were obtained. There is no
fracture or dislocation. The joint spaces appear normal. No erosive
change.
IMPRESSION: No fracture or dislocation.  No appreciable arthropathy.

## 2017-07-14 ENCOUNTER — Emergency Department (HOSPITAL_BASED_OUTPATIENT_CLINIC_OR_DEPARTMENT_OTHER)
Admission: EM | Admit: 2017-07-14 | Discharge: 2017-07-14 | Disposition: A | Discharge status: Home / Self Care | Payer: Self-pay | Attending: Emergency Medicine | Admitting: Emergency Medicine

## 2017-07-14 ENCOUNTER — Encounter (HOSPITAL_BASED_OUTPATIENT_CLINIC_OR_DEPARTMENT_OTHER): Payer: Self-pay | Admitting: *Deleted

## 2017-07-14 ENCOUNTER — Other Ambulatory Visit: Payer: Self-pay

## 2017-07-14 DIAGNOSIS — Z794 Long term (current) use of insulin: Secondary | ICD-10-CM | POA: Insufficient documentation

## 2017-07-14 DIAGNOSIS — E119 Type 2 diabetes mellitus without complications: Secondary | ICD-10-CM | POA: Insufficient documentation

## 2017-07-14 DIAGNOSIS — T7840XA Allergy, unspecified, initial encounter: Secondary | ICD-10-CM

## 2017-07-14 MED ORDER — DIPHENHYDRAMINE HCL 50 MG/ML IJ SOLN
25.0000 mg | Freq: Once | INTRAMUSCULAR | Status: AC
  Administered 2017-07-14: 25 mg via INTRAMUSCULAR
  Filled 2017-07-14: qty 1

## 2017-07-14 MED ORDER — DEXAMETHASONE SODIUM PHOSPHATE 10 MG/ML IJ SOLN
10.0000 mg | Freq: Once | INTRAMUSCULAR | Status: AC
  Administered 2017-07-14: 10 mg via INTRAMUSCULAR
  Filled 2017-07-14: qty 1

## 2017-07-14 MED ORDER — FAMOTIDINE 20 MG PO TABS
20.0000 mg | ORAL_TABLET | Freq: Once | ORAL | Status: AC
  Administered 2017-07-14: 20 mg via ORAL
  Filled 2017-07-14: qty 1

## 2017-07-14 MED ORDER — PREDNISONE 20 MG PO TABS: ORAL_TABLET | ORAL | 0 refills | Status: AC

## 2017-07-14 MED ORDER — HYDROXYZINE HCL 25 MG PO TABS: 25.0000 mg | ORAL_TABLET | Freq: Four times a day (QID) | ORAL | 0 refills | Status: AC

## 2017-07-14 NOTE — ED Notes (Addendum)
Alert, NAD, calm, interactive, resps e/u, speaking in clear complete sentences, no dyspnea noted, skin W&D, VSS, c/o forehead and orbital burning and swelling, R eye swollen >L, (denies: airway throat or mouth sx, sob, nausea, or dizziness). EDP into room. Took benadryl PTA.

## 2017-07-14 NOTE — ED Provider Notes (Signed)
[facility] EMERGENCY DEPARTMENT Provider Note   CSN: [id number] Arrival date & time: 07/14/17  1813     History   Chief Complaint Chief Complaint  Patient presents with  . Allergic Reaction    HPI Tracy Colon is a 57 y.o. female.  Patient is a 57 year old female who presents with an allergic reaction.  She states that 2 days ago she used hair dye and she woke up this morning with swelling to her face.  She had a similar reaction about 10 years ago to hair dye.  She states she has not used hair dye since that time until 2 days ago.  She reports that she now has swelling around her eyes.  She denies any lip swelling.  She denies any airway swelling.  No shortness of breath.  No difficulty swallowing.  No change in her voice.  No wheezing.  No rash anywhere else.  She states her face is very itchy.  She took some Benadryl prior to arrival with no improvement in symptoms.      Past Medical History:  Diagnosis Date  . Diabetes mellitus without complication (HCC)   . Thyroid disease   . Vertigo     There are no active problems to display for this patient.   Past Surgical History:  Procedure Laterality Date  . THYROIDECTOMY    . TONSILLECTOMY    . TUBAL LIGATION      OB History    No data available       Home Medications    Prior to Admission medications   Medication Sig Start Date End Date Taking? Authorizing Provider  insulin aspart (NOVOLOG) 100 UNIT/ML injection Inject into the skin 3 (three) times daily before meals.   Yes [provider]  hydrOXYzine (ATARAX/VISTARIL) 25 MG tablet Take 1 tablet (25 mg total) by mouth every 6 (six) hours. 07/14/17   Rolan BuccoBelfi, Gene Colee, MD  insulin detemir (LEVEMIR) 100 UNIT/ML injection Inject 40 Units into the skin daily.     [provider]  predniSONE (DELTASONE) 20 MG tablet 1 tabs po daily x 4 days 07/14/17   Rolan BuccoBelfi, Lilianne Delair, MD  terbinafine (LAMISIL AT) 1 % cream Apply 1 application topically 2 (two)  times daily. 07/12/16   Garlon HatchetSanders, Lisa M, PA-C    Family History No family history on file.  Social History Social History   Tobacco Use  . Smoking status: Never Smoker  . Smokeless tobacco: Never Used  Substance Use Topics  . Alcohol use: No  . Drug use: No     Allergies   Naprosyn [naproxen]   Review of Systems Review of Systems  Constitutional: Negative for chills, diaphoresis, fatigue and fever.  HENT: Positive for facial swelling. Negative for congestion, rhinorrhea and sneezing.   Eyes: Negative.   Respiratory: Negative for cough, chest tightness and shortness of breath.   Cardiovascular: Negative for chest pain and leg swelling.  Gastrointestinal: Negative for abdominal pain, blood in stool, diarrhea, nausea and vomiting.  Genitourinary: Negative for difficulty urinating, flank pain, frequency and hematuria.  Musculoskeletal: Negative for arthralgias and back pain.  Skin: Positive for rash.  Neurological: Negative for dizziness, speech difficulty, weakness, numbness and headaches.     Physical Exam Updated Vital Signs BP (!) 115/91 (BP Location: Right Arm)   Pulse (!) 102   Temp 99.2 F (37.3 C) (Oral)   Resp 18   Ht 5\' 9"  (1.753 m)   Wt 80.7 kg (178 lb)   SpO2 98%  BMI 26.29 kg/m   Physical Exam  Constitutional: She is oriented to person, place, and time. She appears well-developed and well-nourished.  HENT:  Head: Normocephalic and atraumatic.  Patient has some swelling to her forehead and around her eyes.  There is no visible rash.  No vesicles.  No angioedema.  Eyes: Pupils are equal, round, and reactive to light.  Neck: Normal range of motion. Neck supple.  Cardiovascular: Normal rate, regular rhythm and normal heart sounds.  Pulmonary/Chest: Effort normal and breath sounds normal. No respiratory distress. She has no wheezes. She has no rales. She exhibits no tenderness.  Abdominal: Soft. Bowel sounds are normal. There is no tenderness. There is  no rebound and no guarding.  Musculoskeletal: Normal range of motion. She exhibits no edema.  Lymphadenopathy:    She has no cervical adenopathy.  Neurological: She is alert and oriented to person, place, and time.  Skin: Skin is warm and dry. No rash noted.  Psychiatric: She has a normal mood and affect.     ED Treatments / Results  Labs (all labs ordered are listed, but only abnormal results are displayed) Labs Reviewed - No data to display  EKG  EKG Interpretation None       Radiology No results found.  Procedures Procedures (including critical care time)  Medications Ordered in ED Medications  dexamethasone (DECADRON) injection 10 mg (10 mg Intramuscular Given 07/14/17 2213)  diphenhydrAMINE (BENADRYL) injection 25 mg (25 mg Intramuscular Given 07/14/17 2213)  famotidine (PEPCID) tablet 20 mg (20 mg Oral Given 07/14/17 2213)     Initial Impression / Assessment and Plan / ED Course  I have reviewed the triage vital signs and the nursing notes.  Pertinent labs & imaging results that were available during my care of the patient were reviewed by me and considered in my medical decision making (see chart for details).     Patient is a 57 year old female who presents with facial swelling related to an allergy to hair dye.  She has no airway involvement.  No angioedema.  The symptoms improved after treatment in the emergency department with Decadron, Benadryl and Pepcid.  She is otherwise well-appearing.  She was discharged home in good condition.  She was given prescriptions for prednisone and Atarax.  She was advised to keep an eye on her blood sugars as they can become elevated with steroids.  Return precautions were given.  Final Clinical Impressions(s) / ED Diagnoses   Final diagnoses:  Allergic reaction, initial encounter    ED Discharge Orders        Ordered    predniSONE (DELTASONE) 20 MG tablet     07/14/17 2302    hydrOXYzine (ATARAX/VISTARIL) 25 MG tablet   Every 6 hours     07/14/17 2302       Rolan Bucco, MD 07/14/17 2307

## 2017-07-14 NOTE — ED Notes (Signed)
Eye is more swollen now than upon arrival

## 2017-07-14 NOTE — ED Triage Notes (Signed)
Pt reports facial itching and swelling after having her hair colored yesterday. Resp even and unlabored. NAD

## 2019-05-05 ENCOUNTER — Other Ambulatory Visit: Payer: Self-pay

## 2019-05-05 ENCOUNTER — Emergency Department (HOSPITAL_BASED_OUTPATIENT_CLINIC_OR_DEPARTMENT_OTHER)
Admission: EM | Admit: 2019-05-05 | Discharge: 2019-05-06 | Disposition: A | Discharge status: Home / Self Care | Payer: Self-pay | Attending: Emergency Medicine | Admitting: Emergency Medicine

## 2019-05-05 ENCOUNTER — Encounter (HOSPITAL_BASED_OUTPATIENT_CLINIC_OR_DEPARTMENT_OTHER): Payer: Self-pay | Admitting: *Deleted

## 2019-05-05 DIAGNOSIS — H409 Unspecified glaucoma: Secondary | ICD-10-CM | POA: Insufficient documentation

## 2019-05-05 DIAGNOSIS — H5711 Ocular pain, right eye: Secondary | ICD-10-CM

## 2019-05-05 DIAGNOSIS — Z794 Long term (current) use of insulin: Secondary | ICD-10-CM | POA: Insufficient documentation

## 2019-05-05 DIAGNOSIS — E119 Type 2 diabetes mellitus without complications: Secondary | ICD-10-CM | POA: Insufficient documentation

## 2019-05-05 DIAGNOSIS — Z79899 Other long term (current) drug therapy: Secondary | ICD-10-CM | POA: Insufficient documentation

## 2019-05-05 DIAGNOSIS — H538 Other visual disturbances: Secondary | ICD-10-CM

## 2019-05-05 MED ORDER — TIMOLOL MALEATE 0.5 % OP SOLN
1.0000 [drp] | Freq: Once | OPHTHALMIC | Status: AC
  Administered 2019-05-05: 1 [drp] via OPHTHALMIC

## 2019-05-05 MED ORDER — FLUORESCEIN SODIUM 1 MG OP STRP
1.0000 | ORAL_STRIP | Freq: Once | OPHTHALMIC | Status: AC
  Administered 2019-05-05: 23:00:00 1 via OPHTHALMIC

## 2019-05-05 MED ORDER — TIMOLOL MALEATE 0.5 % OP SOLN
1.0000 [drp] | Freq: Once | OPHTHALMIC | Status: DC
  Filled 2019-05-05: qty 5

## 2019-05-05 MED ORDER — ACETAZOLAMIDE 250 MG PO TABS
ORAL_TABLET | ORAL | Status: AC
  Filled 2019-05-05: qty 2

## 2019-05-05 MED ORDER — FLUORESCEIN SODIUM 1 MG OP STRP
ORAL_STRIP | OPHTHALMIC | Status: AC
  Administered 2019-05-05: 1 via OPHTHALMIC
  Filled 2019-05-05: qty 1

## 2019-05-05 MED ORDER — TETRACAINE HCL 0.5 % OP SOLN
2.0000 [drp] | Freq: Once | OPHTHALMIC | Status: AC
  Administered 2019-05-05: 23:00:00 1 [drp] via OPHTHALMIC

## 2019-05-05 MED ORDER — OXYCODONE-ACETAMINOPHEN 5-325 MG PO TABS
1.0000 | ORAL_TABLET | Freq: Once | ORAL | Status: AC
  Administered 2019-05-05: 1 via ORAL
  Filled 2019-05-05: qty 1

## 2019-05-05 MED ORDER — PILOCARPINE HCL 2 % OP SOLN
1.0000 [drp] | Freq: Once | OPHTHALMIC | Status: DC
  Filled 2019-05-05: qty 15

## 2019-05-05 MED ORDER — ACETAZOLAMIDE 250 MG PO TABS
500.0000 mg | ORAL_TABLET | Freq: Once | ORAL | Status: AC
  Administered 2019-05-05: 500 mg via ORAL

## 2019-05-05 MED ORDER — ACETAZOLAMIDE SODIUM 500 MG IJ SOLR
500.0000 mg | Freq: Once | INTRAMUSCULAR | Status: DC
  Filled 2019-05-05: qty 500

## 2019-05-05 MED ORDER — APRACLONIDINE HCL 1 % OP SOLN
1.0000 [drp] | Freq: Once | OPHTHALMIC | Status: DC
  Filled 2019-05-05: qty 2.4

## 2019-05-05 MED ORDER — TETRACAINE HCL 0.5 % OP SOLN
OPHTHALMIC | Status: AC
  Administered 2019-05-05: 1 [drp] via OPHTHALMIC
  Filled 2019-05-05: qty 4

## 2019-05-05 NOTE — ED Triage Notes (Signed)
Pt c/o right eye blurred vision x 4 days increased in BP and h/a

## 2019-05-05 NOTE — ED Notes (Signed)
Pt states she needs new glasses

## 2019-05-05 NOTE — ED Notes (Signed)
ED Provider at bedside. 

## 2019-05-05 NOTE — ED Provider Notes (Signed)
[facility] EMERGENCY DEPARTMENT Provider Note   CSN: [id number] Arrival date & time: 05/05/19  1805     History   Chief Complaint Chief Complaint  Patient presents with  . Blurred Vision    HPI Tracy Colon is a 58 y.o. female.     The history is provided by the patient and medical records. No language interpreter was used.  Eye Pain This is a new problem. The current episode started more than 2 days ago. The problem occurs constantly. The problem has been gradually worsening. Pertinent negatives include no chest pain, no abdominal pain, no headaches and no shortness of breath. Nothing (bright light) aggravates the symptoms. Nothing relieves the symptoms. She has tried nothing for the symptoms. The treatment provided no relief.    Past Medical History:  Diagnosis Date  . Diabetes mellitus without complication (HCC)   . Thyroid disease   . Vertigo     There are no active problems to display for this patient.   Past Surgical History:  Procedure Laterality Date  . THYROIDECTOMY    . TONSILLECTOMY    . TUBAL LIGATION       OB History   No obstetric history on file.      Home Medications    Prior to Admission medications   Medication Sig Start Date End Date Taking? Authorizing Provider  hydrOXYzine (ATARAX/VISTARIL) 25 MG tablet Take 1 tablet (25 mg total) by mouth every 6 (six) hours. 07/14/17   Rolan Bucco, MD  insulin aspart (NOVOLOG) 100 UNIT/ML injection Inject into the skin 3 (three) times daily before meals.    [provider]  insulin detemir (LEVEMIR) 100 UNIT/ML injection Inject 40 Units into the skin daily.     [provider]  predniSONE (DELTASONE) 20 MG tablet 1 tabs po daily x 4 days 07/14/17   Rolan Bucco, MD  terbinafine (LAMISIL AT) 1 % cream Apply 1 application topically 2 (two) times daily. 07/12/16   Garlon Hatchet, PA-C    Family History No family history on file.  Social History Social History    Tobacco Use  . Smoking status: Never Smoker  . Smokeless tobacco: Never Used  Substance Use Topics  . Alcohol use: No  . Drug use: No     Allergies   Naprosyn [naproxen]   Review of Systems Review of Systems  Constitutional: Negative for chills, diaphoresis, fatigue and fever.  HENT: Negative for congestion, ear pain and rhinorrhea.   Eyes: Positive for photophobia, pain, redness and visual disturbance. Negative for discharge.  Respiratory: Negative for chest tightness, shortness of breath and wheezing.   Cardiovascular: Negative for chest pain.  Gastrointestinal: Negative for abdominal pain.  Genitourinary: Negative for flank pain.  Musculoskeletal: Negative for back pain and neck pain.  Neurological: Negative for facial asymmetry, weakness, light-headedness and headaches.  Psychiatric/Behavioral: Negative for agitation.  All other systems reviewed and are negative.    Physical Exam Updated Vital Signs BP (!) 170/90   Pulse 83   Temp 97.7 F (36.5 C)   Resp 16   Ht 5\' 11"  (1.803 m)   Wt 78.5 kg   SpO2 100%   BMI 24.13 kg/m   Physical Exam Vitals signs and nursing note reviewed.  Constitutional:      General: She is not in acute distress.    Appearance: She is well-developed. She is not ill-appearing, toxic-appearing or diaphoretic.  HENT:     Head: Normocephalic and atraumatic.     Right  Ear: External ear normal.     Left Ear: External ear normal.     Nose: Nose normal. No congestion or rhinorrhea.     Mouth/Throat:     Mouth: Mucous membranes are moist.     Pharynx: No oropharyngeal exudate or posterior oropharyngeal erythema.  Eyes:     General: Visual field deficit present. No scleral icterus.    Intraocular pressure: Right eye pressure is 55 mmHg. Measurements were taken using a handheld tonometer.    Extraocular Movements: Extraocular movements intact.     Right eye: Normal extraocular motion.     Left eye: Normal extraocular motion.      Conjunctiva/sclera:     Right eye: Right conjunctiva is injected.     Left eye: Left conjunctiva is not injected.     Pupils: Pupils are equal, round, and reactive to light. Pupils are equal.     Right eye: No corneal abrasion.     Left eye: No corneal abrasion.     Slit lamp exam:    Right eye: Photophobia present.     Left eye: No photophobia.     Comments: Patient had haziness on the right cornea compared to left.  Neck:     Musculoskeletal: Normal range of motion and neck supple. No muscular tenderness.  Cardiovascular:     Rate and Rhythm: Normal rate.     Pulses: Normal pulses.     Heart sounds: No murmur.  Pulmonary:     Effort: Pulmonary effort is normal. No respiratory distress.     Breath sounds: No stridor. No wheezing, rhonchi or rales.  Chest:     Chest wall: No tenderness.  Abdominal:     General: There is no distension.     Tenderness: There is no abdominal tenderness. There is no rebound.  Skin:    General: Skin is warm.     Findings: No erythema or rash.  Neurological:     General: No focal deficit present.     Mental Status: She is alert and oriented to person, place, and time.     Motor: No abnormal muscle tone.     Deep Tendon Reflexes: Reflexes are normal and symmetric.  Psychiatric:        Mood and Affect: Mood normal.      ED Treatments / Results  Labs (all labs ordered are listed, but only abnormal results are displayed) Labs Reviewed - No data to display  EKG None  Radiology No results found.  Procedures Procedures (including critical care time)  Medications Ordered in ED Medications  acetaZOLAMIDE (DIAMOX) 250 MG tablet (has no administration in time range)  tetracaine (PONTOCAINE) 0.5 % ophthalmic solution 2 drop (1 drop Right Eye Given 05/05/19 2301)  fluorescein ophthalmic strip 1 strip (1 strip Right Eye Given 05/05/19 2301)  timolol (TIMOPTIC) 0.5 % ophthalmic solution 1 drop (1 drop Right Eye Given 05/05/19 2333)  acetaZOLAMIDE  (DIAMOX) tablet 500 mg (500 mg Oral Given 05/05/19 2333)  oxyCODONE-acetaminophen (PERCOCET/ROXICET) 5-325 MG per tablet 1 tablet (1 tablet Oral Given 05/05/19 2332)     Initial Impression / Assessment and Plan / ED Course  I have reviewed the triage vital signs and the nursing notes.  Pertinent labs & imaging results that were available during my care of the patient were reviewed by me and considered in my medical decision making (see chart for details).        Tracy Colon is a 58 y.o. female with a past medical history  significant for diabetes, vertigo, thyroid disease, and chronic vision problems who presents with right eye blurry vision and pain.  Patient reports that she has had vision problems for many years and is managed by Dr. Genia DelMincey with [facility].  She reports that her daughter has lost vision due to glaucoma and patient thinks she has had a form of glaucoma for which she is getting regular injections every few months.  She reports she missed the October injection and has not yet rescheduled it.  She says that over the last 3 days, she has had worsening pain in her right eye with redness and blurry vision.  She denies trauma or injuries.  She does not have a specific foreign body that she knows of getting in it.  She denies nausea, vomiting, urinary symptoms or GI symptoms.  No recent fevers or chills.  No Covid exposures to her knowledge.  On exam, patient does have injected conjunctiva on the right.  Patient's pupil is slightly hazy.  It is reactive at 4 mm and symmetric.  There was no consensual photophobia.  Patient does have direct photophobia.  Normal extraocular movements.  No tenderness around the eye.  Patient's visual acuity was 20/200 on both eyes.  She reports her glasses do not work well.  She did subjectively think that the right eye was worse than the left.  Her visual fields were normal on the left and she could not identify finger counts on the right in  any field.  Fluorescein eye exam was performed with no evidence of corneal abrasion.  Funduscopy was difficult to see due to the haziness.  Eye pressure was checked and was found to be 55 on one reading and then error elevated on the others.  Clinically this is concerning for an acute glaucoma.  Initially, several drops were ordered and IV acetazolamide was ordered however I spoke with the on-call ophthalmologist, Dr. Allyne GeeSanders, who recommended a temporal drop and oral acetazolamide.  He suspects that the patient was getting injections to prevent neovascularization glaucoma over the acute angle glaucoma.  He recommended we speak with her ophthalmologist, provide these medications, and have her see him in clinic tomorrow.  I then spoke with Dr. Genia DelMincey, the patient's primary ophthalmologist who agreed that patient was safe for discharge with the medications of the atenolol drop, pain medication, and oral acetazolamide to be taken twice a day.  He also will see her in clinic in several hours at 9 AM in the morning.  Based on her symptoms of discomfort, we have a low suspicion for a stroke, vitreous detachment or retinal detachment, and we did not see any evidence of corneal abrasion.  Low suspicion for infection or a cellulitis.  Low suspicion for a retrobulbar hematoma given lack of trauma.  Patient and family agreed with plan of care to follow-up with ophthalmology in several hours.   Final Clinical Impressions(s) / ED Diagnoses   Final diagnoses:  Acute right eye pain  Blurry vision, right eye  Glaucoma of right eye, unspecified glaucoma type    ED Discharge Orders         Ordered    oxyCODONE-acetaminophen (PERCOCET/ROXICET) 5-325 MG tablet  Every 4 hours PRN     05/06/19 0018    acetaZOLAMIDE (DIAMOX SEQUELS) 500 MG capsule  2 times daily     05/06/19 0018          Clinical Impression: 1. Acute right eye pain   2. Blurry vision, right eye  3. Glaucoma of right eye, unspecified  glaucoma type     Disposition: Discharge  Condition: Good  I have discussed the results, Dx and Tx plan with the pt(& family if present). He/she/they expressed understanding and agree(s) with the plan. Discharge instructions discussed at great length. Strict return precautions discussed and pt &/or family have verbalized understanding of the instructions. No further questions at time of discharge.    New Prescriptions   ACETAZOLAMIDE (DIAMOX SEQUELS) 500 MG CAPSULE    Take 1 capsule (500 mg total) by mouth 2 (two) times daily.   OXYCODONE-ACETAMINOPHEN (PERCOCET/ROXICET) 5-325 MG TABLET    Take 1 tablet by mouth every 4 (four) hours as needed.    Follow Up: Marcelline Deist, MD [address] [place] [address] [phone]  Go to  9am     Tegeler, Canary Brim, MD 05/06/19 [phone]

## 2019-05-06 MED ORDER — ACETAZOLAMIDE ER 500 MG PO CP12: 500.0000 mg | ORAL_CAPSULE | Freq: Two times a day (BID) | ORAL | 0 refills | Status: AC

## 2019-05-06 MED ORDER — OXYCODONE-ACETAMINOPHEN 5-325 MG PO TABS: 1.0000 | ORAL_TABLET | ORAL | 0 refills | Status: AC | PRN

## 2019-05-06 NOTE — Discharge Instructions (Signed)
Your eye pain and vision loss is concerning for acute glaucoma causing your symptoms.  Your right eye had elevated pressures.  We spoke to both the on-call ophthalmologist and your ophthalmologist who recommended the atenolol drop to be used twice a day and acetazolamide twice a day and Dr. Alanda Slim will see you in clinic at 9 AM tomorrow morning.  Please also use the pain medicine help with your discomfort.  Please go to the appointment tomorrow.  If any symptoms change or worsen, please return to the nearest emergency department.

## 2019-10-17 ENCOUNTER — Emergency Department (HOSPITAL_BASED_OUTPATIENT_CLINIC_OR_DEPARTMENT_OTHER): Payer: Self-pay

## 2019-10-17 ENCOUNTER — Encounter (HOSPITAL_BASED_OUTPATIENT_CLINIC_OR_DEPARTMENT_OTHER): Payer: Self-pay

## 2019-10-17 ENCOUNTER — Other Ambulatory Visit: Payer: Self-pay

## 2019-10-17 ENCOUNTER — Emergency Department (HOSPITAL_BASED_OUTPATIENT_CLINIC_OR_DEPARTMENT_OTHER)
Admission: EM | Admit: 2019-10-17 | Discharge: 2019-10-17 | Disposition: A | Discharge status: Home / Self Care | Payer: Self-pay | Attending: Emergency Medicine | Admitting: Emergency Medicine

## 2019-10-17 DIAGNOSIS — R609 Edema, unspecified: Secondary | ICD-10-CM | POA: Insufficient documentation

## 2019-10-17 DIAGNOSIS — R0789 Other chest pain: Secondary | ICD-10-CM | POA: Insufficient documentation

## 2019-10-17 DIAGNOSIS — Z794 Long term (current) use of insulin: Secondary | ICD-10-CM | POA: Insufficient documentation

## 2019-10-17 DIAGNOSIS — R600 Localized edema: Secondary | ICD-10-CM

## 2019-10-17 DIAGNOSIS — Z79899 Other long term (current) drug therapy: Secondary | ICD-10-CM | POA: Insufficient documentation

## 2019-10-17 DIAGNOSIS — R0602 Shortness of breath: Secondary | ICD-10-CM | POA: Insufficient documentation

## 2019-10-17 DIAGNOSIS — E119 Type 2 diabetes mellitus without complications: Secondary | ICD-10-CM | POA: Insufficient documentation

## 2019-10-17 HISTORY — DX: Polyneuropathy, unspecified: G62.9

## 2019-10-17 LAB — CBC WITH DIFFERENTIAL/PLATELET
Abs Immature Granulocytes: 0.01 10*3/uL (ref 0.00–0.07)
Basophils Absolute: 0 10*3/uL (ref 0.0–0.1)
Basophils Relative: 0 %
Eosinophils Absolute: 0.1 10*3/uL (ref 0.0–0.5)
Eosinophils Relative: 1 %
HCT: 36.7 % (ref 36.0–46.0)
Hemoglobin: 11.7 g/dL — ABNORMAL LOW (ref 12.0–15.0)
Immature Granulocytes: 0 %
Lymphocytes Relative: 41 %
Lymphs Abs: 2.1 10*3/uL (ref 0.7–4.0)
MCH: 25.5 pg — ABNORMAL LOW (ref 26.0–34.0)
MCHC: 31.9 g/dL (ref 30.0–36.0)
MCV: 80.1 fL (ref 80.0–100.0)
Monocytes Absolute: 0.4 10*3/uL (ref 0.1–1.0)
Monocytes Relative: 7 %
Neutro Abs: 2.5 10*3/uL (ref 1.7–7.7)
Neutrophils Relative %: 51 %
Platelets: 270 10*3/uL (ref 150–400)
RBC: 4.58 MIL/uL (ref 3.87–5.11)
RDW: 12.5 % (ref 11.5–15.5)
WBC: 5.1 10*3/uL (ref 4.0–10.5)
nRBC: 0 % (ref 0.0–0.2)

## 2019-10-17 LAB — COMPREHENSIVE METABOLIC PANEL
ALT: 20 U/L (ref 0–44)
AST: 17 U/L (ref 15–41)
Albumin: 3.7 g/dL (ref 3.5–5.0)
Alkaline Phosphatase: 56 U/L (ref 38–126)
Anion gap: 10 (ref 5–15)
BUN: 18 mg/dL (ref 6–20)
CO2: 27 mmol/L (ref 22–32)
Calcium: 9.2 mg/dL (ref 8.9–10.3)
Chloride: 104 mmol/L (ref 98–111)
Creatinine, Ser: 0.76 mg/dL (ref 0.44–1.00)
GFR calc Af Amer: >60 mL/min (ref >60)
GFR calc non Af Amer: >60 mL/min (ref >60)
Glucose, Bld: 218 mg/dL — ABNORMAL HIGH (ref 70–99)
Potassium: 4.1 mmol/L (ref 3.5–5.1)
Sodium: 141 mmol/L (ref 135–145)
Total Bilirubin: 0.2 mg/dL — ABNORMAL LOW (ref 0.3–1.2)
Total Protein: 6.4 g/dL — ABNORMAL LOW (ref 6.5–8.1)

## 2019-10-17 LAB — BRAIN NATRIURETIC PEPTIDE: B Natriuretic Peptide: 10.4 pg/mL (ref 0.0–100.0)

## 2019-10-17 LAB — TROPONIN I (HIGH SENSITIVITY): Troponin I (High Sensitivity): 2 ng/L (ref <18)

## 2019-10-17 MED ORDER — GABAPENTIN 100 MG PO CAPS: 300.0000 mg | ORAL_CAPSULE | Freq: Three times a day (TID) | ORAL | 0 refills | Status: AC

## 2019-10-17 NOTE — Discharge Instructions (Signed)
Try to elevate your legs.  Follow-up with your family doctor in the office.  There is no laboratory finding today that was consistent with heart attack or heart failure or kidney or liver damage.

## 2019-10-17 NOTE — ED Triage Notes (Addendum)
Pt c/o bilat leg swelling x 5 days-CP x 2 days-denies at present-also c/o " I have to prop myself up to sleep" x 2 weeks-NAD-steady gait

## 2019-10-17 NOTE — ED Provider Notes (Signed)
[facility] EMERGENCY DEPARTMENT Provider Note   CSN: [id number] Arrival date & time: 10/17/19  1656     History Chief Complaint  Patient presents with  . Leg Swelling    Shallen Johnsen is a 59 y.o. female.  59 yo F with a chief complaints of chest pain.  States that this happened in the morning.  Usually resolves by mid morning.  Describes as a fullness to her epigastrium into the right side of her chest.  Nothing seems to make it better or worse.  Denies shortness of breath with this.  Going on for the past couple days.  She is also noticed some lower extremity edema to both of her ankles going on for about a week.  Has a history of peripheral neuropathy and that seems quite irritating for her.  She denies abdominal pain.  Denies nausea or vomiting.  She does feel that she is having trouble breathing when she lays flat and better when she sits up on a couple pillows.  The history is provided by the patient.  Illness Severity:  Moderate Onset quality:  Gradual Duration:  1 week Timing:  Constant Progression:  Worsening Chronicity:  New Associated symptoms: chest pain and shortness of breath   Associated symptoms: no congestion, no fever, no headaches, no myalgias, no nausea, no rhinorrhea, no vomiting and no wheezing        Past Medical History:  Diagnosis Date  . Diabetes mellitus without complication (HCC)   . Neuropathy   . Thyroid disease   . Vertigo     There are no problems to display for this patient.   Past Surgical History:  Procedure Laterality Date  . CATARACT EXTRACTION    . THYROIDECTOMY    . TONSILLECTOMY    . TUBAL LIGATION       OB History   No obstetric history on file.     No family history on file.  Social History   Tobacco Use  . Smoking status: Never Smoker  . Smokeless tobacco: Never Used  Substance Use Topics  . Alcohol use: No  . Drug use: No    Home Medications Prior to Admission medications   Medication Sig  Start Date End Date Taking? Authorizing Provider  acetaZOLAMIDE (DIAMOX SEQUELS) 500 MG capsule Take 1 capsule (500 mg total) by mouth 2 (two) times daily. 05/06/19   Tegeler, Canary Brim, MD  hydrOXYzine (ATARAX/VISTARIL) 25 MG tablet Take 1 tablet (25 mg total) by mouth every 6 (six) hours. 07/14/17   Rolan Bucco, MD  insulin aspart (NOVOLOG) 100 UNIT/ML injection Inject into the skin 3 (three) times daily before meals.    [provider]  insulin detemir (LEVEMIR) 100 UNIT/ML injection Inject 40 Units into the skin daily.     [provider]  oxyCODONE-acetaminophen (PERCOCET/ROXICET) 5-325 MG tablet Take 1 tablet by mouth every 4 (four) hours as needed. 05/06/19   Tegeler, Canary Brim, MD  predniSONE (DELTASONE) 20 MG tablet 1 tabs po daily x 4 days 07/14/17   Rolan Bucco, MD  terbinafine (LAMISIL AT) 1 % cream Apply 1 application topically 2 (two) times daily. 07/12/16   Garlon Hatchet, PA-C    Allergies    Naprosyn [naproxen]  Review of Systems   Review of Systems  Constitutional: Negative for chills and fever.  HENT: Negative for congestion and rhinorrhea.   Eyes: Negative for redness and visual disturbance.  Respiratory: Positive for shortness of breath. Negative for wheezing.   Cardiovascular:  Positive for chest pain and leg swelling. Negative for palpitations.  Gastrointestinal: Negative for nausea and vomiting.  Genitourinary: Negative for dysuria and urgency.  Musculoskeletal: Negative for arthralgias and myalgias.  Skin: Negative for pallor and wound.  Neurological: Negative for dizziness and headaches.    Physical Exam Updated Vital Signs BP (!) 160/89   Pulse 86   Temp 98.1 F (36.7 C) (Oral)   Resp (!) 25   Ht 5\' 9"  (1.753 m)   Wt 84.4 kg   SpO2 100%   BMI 27.47 kg/m   Physical Exam Vitals and nursing note reviewed.  Constitutional:      General: She is not in acute distress.    Appearance: She is well-developed. She is not  diaphoretic.  HENT:     Head: Normocephalic and atraumatic.  Eyes:     Pupils: Pupils are equal, round, and reactive to light.  Cardiovascular:     Rate and Rhythm: Normal rate and regular rhythm.     Heart sounds: No murmur. No friction rub. No gallop.   Pulmonary:     Effort: Pulmonary effort is normal.     Breath sounds: No wheezing or rales.  Abdominal:     General: There is no distension.     Palpations: Abdomen is soft.     Tenderness: There is no abdominal tenderness.  Musculoskeletal:        General: No tenderness.     Cervical back: Normal range of motion and neck supple.     Comments: Trace edema bilateral ankles  Skin:    General: Skin is warm and dry.  Neurological:     Mental Status: She is alert and oriented to person, place, and time.  Psychiatric:        Behavior: Behavior normal.     ED Results / Procedures / Treatments   Labs (all labs ordered are listed, but only abnormal results are displayed) Labs Reviewed  CBC WITH DIFFERENTIAL/PLATELET - Abnormal; Notable for the following components:      Result Value   Hemoglobin 11.7 (*)    MCH 25.5 (*)    All other components within normal limits  COMPREHENSIVE METABOLIC PANEL - Abnormal; Notable for the following components:   Glucose, Bld 218 (*)    Total Protein 6.4 (*)    Total Bilirubin 0.2 (*)    All other components within normal limits  BRAIN NATRIURETIC PEPTIDE  TROPONIN I (HIGH SENSITIVITY)    EKG EKG Interpretation  Date/Time:  Friday Oct 17 2019 17:17:05 EDT Ventricular Rate:  89 PR Interval:    QRS Duration: 78 QT Interval:  345 QTC Calculation: 420 R Axis:   50 Text Interpretation: Sinus rhythm No significant change since last tracing Confirmed by 08[birth year]1981 [phone]) on 10/17/2019 5:55:47 PM   Radiology DG Chest 2 View  Result Date: 10/17/2019 CLINICAL DATA:  Chest pain x5 days. EXAM: CHEST - 2 VIEW COMPARISON:  July 03, 2018 FINDINGS: The heart size and mediastinal contours are  within normal limits. Both lungs are clear. The visualized skeletal structures are unremarkable. Aortic calcifications are noted. IMPRESSION: No active cardiopulmonary disease. Electronically Signed   By: July 05, 2018 M.D.   On: 10/17/2019 18:19    Procedures Procedures (including critical care time)  Medications Ordered in ED Medications - No data to display  ED Course  I have reviewed the triage vital signs and the nursing notes.  Pertinent labs & imaging results that were available during my care of the  patient were reviewed by me and considered in my medical decision making (see chart for details).    MDM Rules/Calculators/A&P                      59 yo F with a chief complaints of lower extremity edema chest pain and orthopnea.  Patient's edema is very minimal.  Will obtain a laboratory evaluation chest x-ray.  Clear lung sounds for me.  Reassess.  BMP is normal tropes negative no significant LFT elevation renal functions at her baseline.  We will discharge the patient home.  Have her try and elevate her legs.  PCP follow-up.  6:25 PM:  I have discussed the diagnosis/risks/treatment options with the patient and believe the pt to be eligible for discharge home to follow-up with PCP. We also discussed returning to the ED immediately if new or worsening sx occur. We discussed the sx which are most concerning (e.g., sudden worsening pain, fever, inability to tolerate by mouth) that necessitate immediate return. Medications administered to the patient during their visit and any new prescriptions provided to the patient are listed below.  Medications given during this visit Medications - No data to display   The patient appears reasonably screen and/or stabilized for discharge and I doubt any other medical condition or other [facility] requiring further screening, evaluation, or treatment in the ED at this time prior to discharge.   Final Clinical Impression(s) / ED Diagnoses Final  diagnoses:  Peripheral edema    Rx / DC Orders ED Discharge Orders    None       Deno Etienne, DO 10/17/19 1825

## 2019-10-17 NOTE — ED Notes (Signed)
Pt requests to speak with EDP re: rx for gabapentin.

## 2019-10-17 NOTE — ED Notes (Signed)
Pt on monitor 

## 2019-11-16 ENCOUNTER — Encounter (HOSPITAL_BASED_OUTPATIENT_CLINIC_OR_DEPARTMENT_OTHER): Payer: Self-pay | Admitting: Emergency Medicine

## 2019-11-16 ENCOUNTER — Emergency Department (HOSPITAL_BASED_OUTPATIENT_CLINIC_OR_DEPARTMENT_OTHER)
Admission: EM | Admit: 2019-11-16 | Discharge: 2019-11-17 | Disposition: A | Discharge status: Home / Self Care | Payer: Self-pay | Attending: Emergency Medicine | Admitting: Emergency Medicine

## 2019-11-16 ENCOUNTER — Other Ambulatory Visit: Payer: Self-pay

## 2019-11-16 DIAGNOSIS — H5712 Ocular pain, left eye: Secondary | ICD-10-CM

## 2019-11-16 DIAGNOSIS — H409 Unspecified glaucoma: Secondary | ICD-10-CM | POA: Insufficient documentation

## 2019-11-16 DIAGNOSIS — E1140 Type 2 diabetes mellitus with diabetic neuropathy, unspecified: Secondary | ICD-10-CM | POA: Insufficient documentation

## 2019-11-16 DIAGNOSIS — Z79899 Other long term (current) drug therapy: Secondary | ICD-10-CM | POA: Insufficient documentation

## 2019-11-16 DIAGNOSIS — Z886 Allergy status to analgesic agent status: Secondary | ICD-10-CM | POA: Insufficient documentation

## 2019-11-16 DIAGNOSIS — E1139 Type 2 diabetes mellitus with other diabetic ophthalmic complication: Secondary | ICD-10-CM | POA: Insufficient documentation

## 2019-11-16 DIAGNOSIS — Z794 Long term (current) use of insulin: Secondary | ICD-10-CM | POA: Insufficient documentation

## 2019-11-16 MED ORDER — TETRACAINE HCL 0.5 % OP SOLN
2.0000 [drp] | Freq: Once | OPHTHALMIC | Status: AC
  Administered 2019-11-16: 2 [drp] via OPHTHALMIC
  Filled 2019-11-16: qty 4

## 2019-11-16 MED ORDER — FLUORESCEIN SODIUM 1 MG OP STRP
1.0000 | ORAL_STRIP | Freq: Once | OPHTHALMIC | Status: AC
  Administered 2019-11-16: 1 via OPHTHALMIC
  Filled 2019-11-16: qty 1

## 2019-11-16 NOTE — ED Triage Notes (Signed)
Pt arrives with left eye redness and blurred vision/eye pain/light sensitivity. Ongoing x2 weeks. Seen by opthalmology and told she needs surgery but cannot afford it.

## 2019-11-17 MED ORDER — HYDROCODONE-ACETAMINOPHEN 5-325 MG PO TABS
1.0000 | ORAL_TABLET | Freq: Once | ORAL | Status: AC
  Administered 2019-11-17: 1 via ORAL
  Filled 2019-11-17: qty 1

## 2019-11-17 NOTE — Discharge Instructions (Addendum)
You were seen today for worsening pain and redness of the left eye.  This is related to your known glaucoma.  Continue your acetazolamide, and eye drops as directed by your eye doctor.  Follow-up with him later tomorrow.  You will ultimately need surgery and have or will be referred to [facility] or [facility].

## 2019-11-17 NOTE — ED Provider Notes (Signed)
[facility] EMERGENCY DEPARTMENT Provider Note   CSN: [id number] Arrival date & time: 11/16/19  2207     History Chief Complaint  Patient presents with  . Eye Pain    Tracy Colon is a 59 y.o. female.  HPI     This a 59 year old female with a history of diabetes, neuropathy, cataracts and glaucoma who presents with left eye pain, redness.  Patient reports a history of the same.  She takes acetazolamide and 2 eyedrops for glaucoma.  She states that she needs to have surgery in that eye but has had difficulty secondary to financial barriers.  She has been referred to [facility] but was unable to get an appointment.  Her primary ophthalmologist is Dr. Genia Del.  Patient states over the last several days she said worsening pain, blurry vision, and redness of that eye.  She also reports headache.  Past Medical History:  Diagnosis Date  . Diabetes mellitus without complication (HCC)   . Neuropathy   . Thyroid disease   . Vertigo     There are no problems to display for this patient.   Past Surgical History:  Procedure Laterality Date  . CATARACT EXTRACTION    . THYROIDECTOMY    . TONSILLECTOMY    . TUBAL LIGATION       OB History   No obstetric history on file.     History reviewed. No pertinent family history.  Social History   Tobacco Use  . Smoking status: Never Smoker  . Smokeless tobacco: Never Used  Substance Use Topics  . Alcohol use: No  . Drug use: No    Home Medications Prior to Admission medications   Medication Sig Start Date End Date Taking? Authorizing Provider  acetaZOLAMIDE (DIAMOX SEQUELS) 500 MG capsule Take 1 capsule (500 mg total) by mouth 2 (two) times daily. 05/06/19   Tegeler, Canary Brim, MD  gabapentin (NEURONTIN) 100 MG capsule Take 3 capsules (300 mg total) by mouth 3 (three) times daily. 10/17/19 11/16/19  Melene Plan, DO  hydrOXYzine (ATARAX/VISTARIL) 25 MG tablet Take 1 tablet (25 mg total) by mouth every 6 (six) hours. 07/14/17    Rolan Bucco, MD  insulin aspart (NOVOLOG) 100 UNIT/ML injection Inject into the skin 3 (three) times daily before meals.    [provider]  insulin detemir (LEVEMIR) 100 UNIT/ML injection Inject 40 Units into the skin daily.     [provider]  oxyCODONE-acetaminophen (PERCOCET/ROXICET) 5-325 MG tablet Take 1 tablet by mouth every 4 (four) hours as needed. 05/06/19   Tegeler, Canary Brim, MD  predniSONE (DELTASONE) 20 MG tablet 1 tabs po daily x 4 days 07/14/17   Rolan Bucco, MD  terbinafine (LAMISIL AT) 1 % cream Apply 1 application topically 2 (two) times daily. 07/12/16   Garlon Hatchet, PA-C    Allergies    Naprosyn [naproxen]  Review of Systems   Review of Systems  Constitutional: Negative for fever.  Eyes: Positive for photophobia, pain, redness and visual disturbance. Negative for itching.  Neurological: Positive for headaches.  All other systems reviewed and are negative.   Physical Exam Updated Vital Signs BP (!) 148/96 (BP Location: Right Arm)   Pulse (!) 102   Temp 98.3 F (36.8 C) (Oral)   Resp 18   SpO2 100%   Physical Exam Vitals and nursing note reviewed.  Constitutional:      Appearance: She is well-developed. She is not ill-appearing.  HENT:     Head: Normocephalic and atraumatic.  Eyes:     General: Lids are normal. Lids are everted, no foreign bodies appreciated.        Right eye: No foreign body or discharge.        Left eye: No foreign body or discharge.     Intraocular pressure: Right eye pressure is 33 mmHg.     Extraocular Movements: Extraocular movements intact.     Pupils: Pupils are equal, round, and reactive to light.     Comments: Left eye with scleral injection, pupil is 3 mm and fixed with minimal light response, extraocular movements intact, unable to obtain tocometry reading  Cardiovascular:     Rate and Rhythm: Normal rate and regular rhythm.  Pulmonary:     Effort: Pulmonary effort is normal. No respiratory  distress.     Breath sounds: No wheezing.  Abdominal:     Palpations: Abdomen is soft.  Musculoskeletal:     Cervical back: Neck supple.     Right lower leg: No edema.     Left lower leg: No edema.  Skin:    General: Skin is warm and dry.  Neurological:     Mental Status: She is alert and oriented to person, place, and time.  Psychiatric:        Mood and Affect: Mood normal.     ED Results / Procedures / Treatments   Labs (all labs ordered are listed, but only abnormal results are displayed) Labs Reviewed - No data to display  EKG None  Radiology No results found.  Procedures Procedures (including critical care time)  Medications Ordered in ED Medications  HYDROcodone-acetaminophen (NORCO/VICODIN) 5-325 MG per tablet 1 tablet (has no administration in time range)  tetracaine (PONTOCAINE) 0.5 % ophthalmic solution 2 drop (2 drops Both Eyes Given 11/16/19 2325)  fluorescein ophthalmic strip 1 strip (1 strip Left Eye Given 11/16/19 2325)    ED Course  I have reviewed the triage vital signs and the nursing notes.  Pertinent labs & imaging results that were available during my care of the patient were reviewed by me and considered in my medical decision making (see chart for details).  Clinical Course as of Nov 16 32  Mon Nov 17, 2019  0032 Spoke to Dr. Alanda Slim, ophthalmology.  He knows the patient well.  No change in medications at this time.  He states that she needs surgery and has been referred for charity care.  He will see her tomorrow but ultimately she needs surgery.   [CH]    Clinical Course User Index [CH] Astra Gregg, Barbette Hair, MD   MDM Rules/Calculators/A&P                       Patient presents with worsening eye pain, redness, vision changes of the left eye.  Extensive history and is in need of surgery for glaucoma.  She is currently on multiple medications for glaucoma.  Her clinical presentation is consistent with acute glaucoma.  However, she is already on  appropriate medications.  I did discuss her with her primary ophthalmologist.  Unfortunately there is nothing more that can be done from medication standpoint and she will need surgery.  Patient is very frustrated that she cannot afford surgery and "I guess it is either money or my eyesight."  Patient was given some pain medication.  I encouraged her to follow-up closely with Dr. Alanda Slim for further referral for charity care.  After history, exam, and medical workup I feel the patient has  been appropriately medically screened and is safe for discharge home. Pertinent diagnoses were discussed with the patient. Patient was given return precautions.   Final Clinical Impression(s) / ED Diagnoses Final diagnoses:  Glaucoma of left eye, unspecified glaucoma type  Pain of left eye    Rx / DC Orders ED Discharge Orders    None       Eleonor Ocon, Mayer Masker, MD 11/17/19 [phone]

## 2019-11-17 NOTE — ED Notes (Signed)
Contacted [facility] (Dr. Genia Del of [facility]).  The on call doctor will contact Dr. Wilkie Aye

## 2020-09-22 ENCOUNTER — Emergency Department (HOSPITAL_BASED_OUTPATIENT_CLINIC_OR_DEPARTMENT_OTHER)
Admission: EM | Admit: 2020-09-22 | Discharge: 2020-09-22 | Disposition: A | Discharge status: Home / Self Care | Payer: Medicaid Other | Attending: Emergency Medicine | Admitting: Emergency Medicine

## 2020-09-22 ENCOUNTER — Encounter (HOSPITAL_BASED_OUTPATIENT_CLINIC_OR_DEPARTMENT_OTHER): Payer: Self-pay | Admitting: *Deleted

## 2020-09-22 ENCOUNTER — Other Ambulatory Visit: Payer: Self-pay

## 2020-09-22 DIAGNOSIS — R600 Localized edema: Secondary | ICD-10-CM | POA: Insufficient documentation

## 2020-09-22 DIAGNOSIS — Z794 Long term (current) use of insulin: Secondary | ICD-10-CM | POA: Insufficient documentation

## 2020-09-22 DIAGNOSIS — M7989 Other specified soft tissue disorders: Secondary | ICD-10-CM | POA: Diagnosis present

## 2020-09-22 DIAGNOSIS — E119 Type 2 diabetes mellitus without complications: Secondary | ICD-10-CM | POA: Insufficient documentation

## 2020-09-22 DIAGNOSIS — I10 Essential (primary) hypertension: Secondary | ICD-10-CM

## 2020-09-22 LAB — CBC WITH DIFFERENTIAL/PLATELET
Abs Immature Granulocytes: 0.01 10*3/uL (ref 0.00–0.07)
Basophils Absolute: 0 10*3/uL (ref 0.0–0.1)
Basophils Relative: 0 %
Eosinophils Absolute: 0.1 10*3/uL (ref 0.0–0.5)
Eosinophils Relative: 2 %
HCT: 38.9 % (ref 36.0–46.0)
Hemoglobin: 12 g/dL (ref 12.0–15.0)
Immature Granulocytes: 0 %
Lymphocytes Relative: 42 %
Lymphs Abs: 2.2 10*3/uL (ref 0.7–4.0)
MCH: 25.6 pg — ABNORMAL LOW (ref 26.0–34.0)
MCHC: 30.8 g/dL (ref 30.0–36.0)
MCV: 82.9 fL (ref 80.0–100.0)
Monocytes Absolute: 0.3 10*3/uL (ref 0.1–1.0)
Monocytes Relative: 6 %
Neutro Abs: 2.6 10*3/uL (ref 1.7–7.7)
Neutrophils Relative %: 50 %
Platelets: 325 10*3/uL (ref 150–400)
RBC: 4.69 MIL/uL (ref 3.87–5.11)
RDW: 12.8 % (ref 11.5–15.5)
WBC: 5.2 10*3/uL (ref 4.0–10.5)
nRBC: 0 % (ref 0.0–0.2)

## 2020-09-22 LAB — COMPREHENSIVE METABOLIC PANEL
ALT: 24 U/L (ref 0–44)
AST: 19 U/L (ref 15–41)
Albumin: 3.3 g/dL — ABNORMAL LOW (ref 3.5–5.0)
Alkaline Phosphatase: 41 U/L (ref 38–126)
Anion gap: 8 (ref 5–15)
BUN: 13 mg/dL (ref 6–20)
CO2: 27 mmol/L (ref 22–32)
Calcium: 9.1 mg/dL (ref 8.9–10.3)
Chloride: 103 mmol/L (ref 98–111)
Creatinine, Ser: 0.73 mg/dL (ref 0.44–1.00)
GFR, Estimated: >60 mL/min (ref >60)
Glucose, Bld: 220 mg/dL — ABNORMAL HIGH (ref 70–99)
Potassium: 3.6 mmol/L (ref 3.5–5.1)
Sodium: 138 mmol/L (ref 135–145)
Total Bilirubin: 0.4 mg/dL (ref 0.3–1.2)
Total Protein: 6.6 g/dL (ref 6.5–8.1)

## 2020-09-22 LAB — BRAIN NATRIURETIC PEPTIDE: B Natriuretic Peptide: 31.6 pg/mL (ref 0.0–100.0)

## 2020-09-22 MED ORDER — HYDROCHLOROTHIAZIDE 25 MG PO TABS: 25.0000 mg | ORAL_TABLET | Freq: Every day | ORAL | 0 refills | Status: AC

## 2020-09-22 MED ORDER — HYDRALAZINE HCL 20 MG/ML IJ SOLN
10.0000 mg | Freq: Once | INTRAMUSCULAR | Status: AC
  Administered 2020-09-22: 10 mg via INTRAVENOUS
  Filled 2020-09-22: qty 1

## 2020-09-22 NOTE — ED Triage Notes (Signed)
Pt pt reports blood pressure too high at dentist to have tooth pulled today also c/o increased leg swelling

## 2020-09-22 NOTE — Discharge Instructions (Signed)
Take HCTZ in addition to your losartan  Please take your blood pressure daily and keep a log  See your doctor in a week.  Please elevate your legs and try compression stockings for the swelling  You need to reschedule your dental procedure  Return to ER if you have worse leg swelling, chest pain, trouble breathing

## 2020-09-22 NOTE — ED Provider Notes (Signed)
[facility] EMERGENCY DEPARTMENT Provider Note   CSN: [id number] Arrival date & time: 09/22/20  1540     History Chief Complaint  Patient presents with  . Hypertension     Tracy Colon is a 60 y.o. female history of diabetes, proteinuria here presenting with hypertension.  Patient was thought to have a dental infection and saw a dentist has been on Augmentin for last week.  Patient went to the dentist today for tooth extraction.  She states that while she was at the dentist office her blood pressure was over 200.  She states that her pain is under control right now.  She states that she is on losartan 50 mg daily as prescribed by her endocrinologist for proteinuria.  She states that she is not on any other blood pressure medicine.  Denies any chest pain or shortness of breath.  She does have some leg swelling that is going on for the last several months.   The history is provided by the patient.       Past Medical History:  Diagnosis Date  . Diabetes mellitus without complication (HCC)   . Neuropathy   . Thyroid disease   . Vertigo     There are no problems to display for this patient.   Past Surgical History:  Procedure Laterality Date  . CATARACT EXTRACTION    . THYROIDECTOMY    . TONSILLECTOMY    . TUBAL LIGATION       OB History   No obstetric history on file.     No family history on file.  Social History   Tobacco Use  . Smoking status: Never Smoker  . Smokeless tobacco: Never Used  Vaping Use  . Vaping Use: Never used  Substance Use Topics  . Alcohol use: No  . Drug use: No    Home Medications Prior to Admission medications   Medication Sig Start Date End Date Taking? Authorizing Provider  acetaZOLAMIDE (DIAMOX SEQUELS) 500 MG capsule Take 1 capsule (500 mg total) by mouth 2 (two) times daily. 05/06/19   Tegeler, Canary Brim, MD  gabapentin (NEURONTIN) 100 MG capsule Take 3 capsules (300 mg total) by mouth 3 (three) times daily.  10/17/19 11/16/19  Melene Plan, DO  hydrOXYzine (ATARAX/VISTARIL) 25 MG tablet Take 1 tablet (25 mg total) by mouth every 6 (six) hours. 07/14/17   Rolan Bucco, MD  insulin aspart (NOVOLOG) 100 UNIT/ML injection Inject into the skin 3 (three) times daily before meals.    [provider]  insulin detemir (LEVEMIR) 100 UNIT/ML injection Inject 40 Units into the skin daily.     [provider]  oxyCODONE-acetaminophen (PERCOCET/ROXICET) 5-325 MG tablet Take 1 tablet by mouth every 4 (four) hours as needed. 05/06/19   Tegeler, Canary Brim, MD  predniSONE (DELTASONE) 20 MG tablet 1 tabs po daily x 4 days 07/14/17   Rolan Bucco, MD  terbinafine (LAMISIL AT) 1 % cream Apply 1 application topically 2 (two) times daily. 07/12/16   Garlon Hatchet, PA-C    Allergies    Naprosyn [naproxen]  Review of Systems   Review of Systems  Cardiovascular: Positive for leg swelling.  All other systems reviewed and are negative.   Physical Exam Updated Vital Signs BP (!) 172/92   Pulse 95   Temp 97.9 F (36.6 C) (Oral)   Resp 16   Ht 5\' 9"  (1.753 m)   Wt 78.5 kg   SpO2 100%   BMI 25.55 kg/m   Physical  Exam Vitals and nursing note reviewed.  Constitutional:      Appearance: Normal appearance.  HENT:     Head: Normocephalic.     Nose: Nose normal.     Mouth/Throat:     Mouth: Mucous membranes are moist.  Eyes:     Extraocular Movements: Extraocular movements intact.     Pupils: Pupils are equal, round, and reactive to light.  Cardiovascular:     Rate and Rhythm: Normal rate and regular rhythm.     Pulses: Normal pulses.     Heart sounds: Normal heart sounds.  Pulmonary:     Effort: Pulmonary effort is normal.     Breath sounds: Normal breath sounds.  Abdominal:     General: Abdomen is flat.     Palpations: Abdomen is soft.  Musculoskeletal:     Cervical back: Normal range of motion and neck supple.     Comments: 1+ edema bilaterally   Skin:    General: Skin is warm.      Capillary Refill: Capillary refill takes less than 2 seconds.  Neurological:     General: No focal deficit present.     Mental Status: She is alert and oriented to person, place, and time.  Psychiatric:        Mood and Affect: Mood normal.        Behavior: Behavior normal.     ED Results / Procedures / Treatments   Labs (all labs ordered are listed, but only abnormal results are displayed) Labs Reviewed  CBC WITH DIFFERENTIAL/PLATELET - Abnormal; Notable for the following components:      Result Value   MCH 25.6 (*)    All other components within normal limits  COMPREHENSIVE METABOLIC PANEL - Abnormal; Notable for the following components:   Glucose, Bld 220 (*)    Albumin 3.3 (*)    All other components within normal limits  BRAIN NATRIURETIC PEPTIDE    EKG EKG Interpretation  Date/Time:  Wednesday September 22 2020 16:11:42 EDT Ventricular Rate:  86 PR Interval:  116 QRS Duration: 75 QT Interval:  350 QTC Calculation: 419 R Axis:   40 Text Interpretation: Sinus rhythm Borderline short PR interval Probable left atrial enlargement No previous ECGs available Confirmed by Richardean Canal [phone]) on 09/22/2020 4:15:29 PM   Radiology No results found.  Procedures Procedures   Medications Ordered in ED Medications  hydrALAZINE (APRESOLINE) injection 10 mg (10 mg Intravenous Given 09/22/20 1613)    ED Course  I have reviewed the triage vital signs and the nursing notes.  Pertinent labs & imaging results that were available during my care of the patient were reviewed by me and considered in my medical decision making (see chart for details).    MDM Rules/Calculators/A&P                         Tracy Colon is a 60 y.o. female here presenting with leg swelling and hypertension.  Patient's blood pressure is over 200.  Patient is on losartan has been compliant with it.  Likely stress from the dental procedure.  Will repeat creatinine level and likely will start  hydrochlorothiazide to help with the leg swelling  5:19 PM Patient's creatinine is normal.  BNP is normal.  Will start patient on hydrochlorothiazide to help with blood pressure and also leg swelling.  Told her to follow-up with PCP in a week   Final Clinical Impression(s) / ED Diagnoses Final diagnoses:  None  Rx / DC Orders ED Discharge Orders    None       Charlynne Pander, MD 09/22/20 1719

## 2020-09-22 NOTE — ED Notes (Signed)
Patient verbalized understanding of dc instructions, prescriptions, follow up referrals and reasons to return to ER for reevaluation.  

## 2021-03-25 IMAGING — CR DG CHEST 2V
2 series · 2 of 2 positions shown · non-contrast
Comparison: July 03, 2018

CLINICAL DATA: Chest pain x5 days.

EXAM:
CHEST - 2 VIEW

[w chest pa]
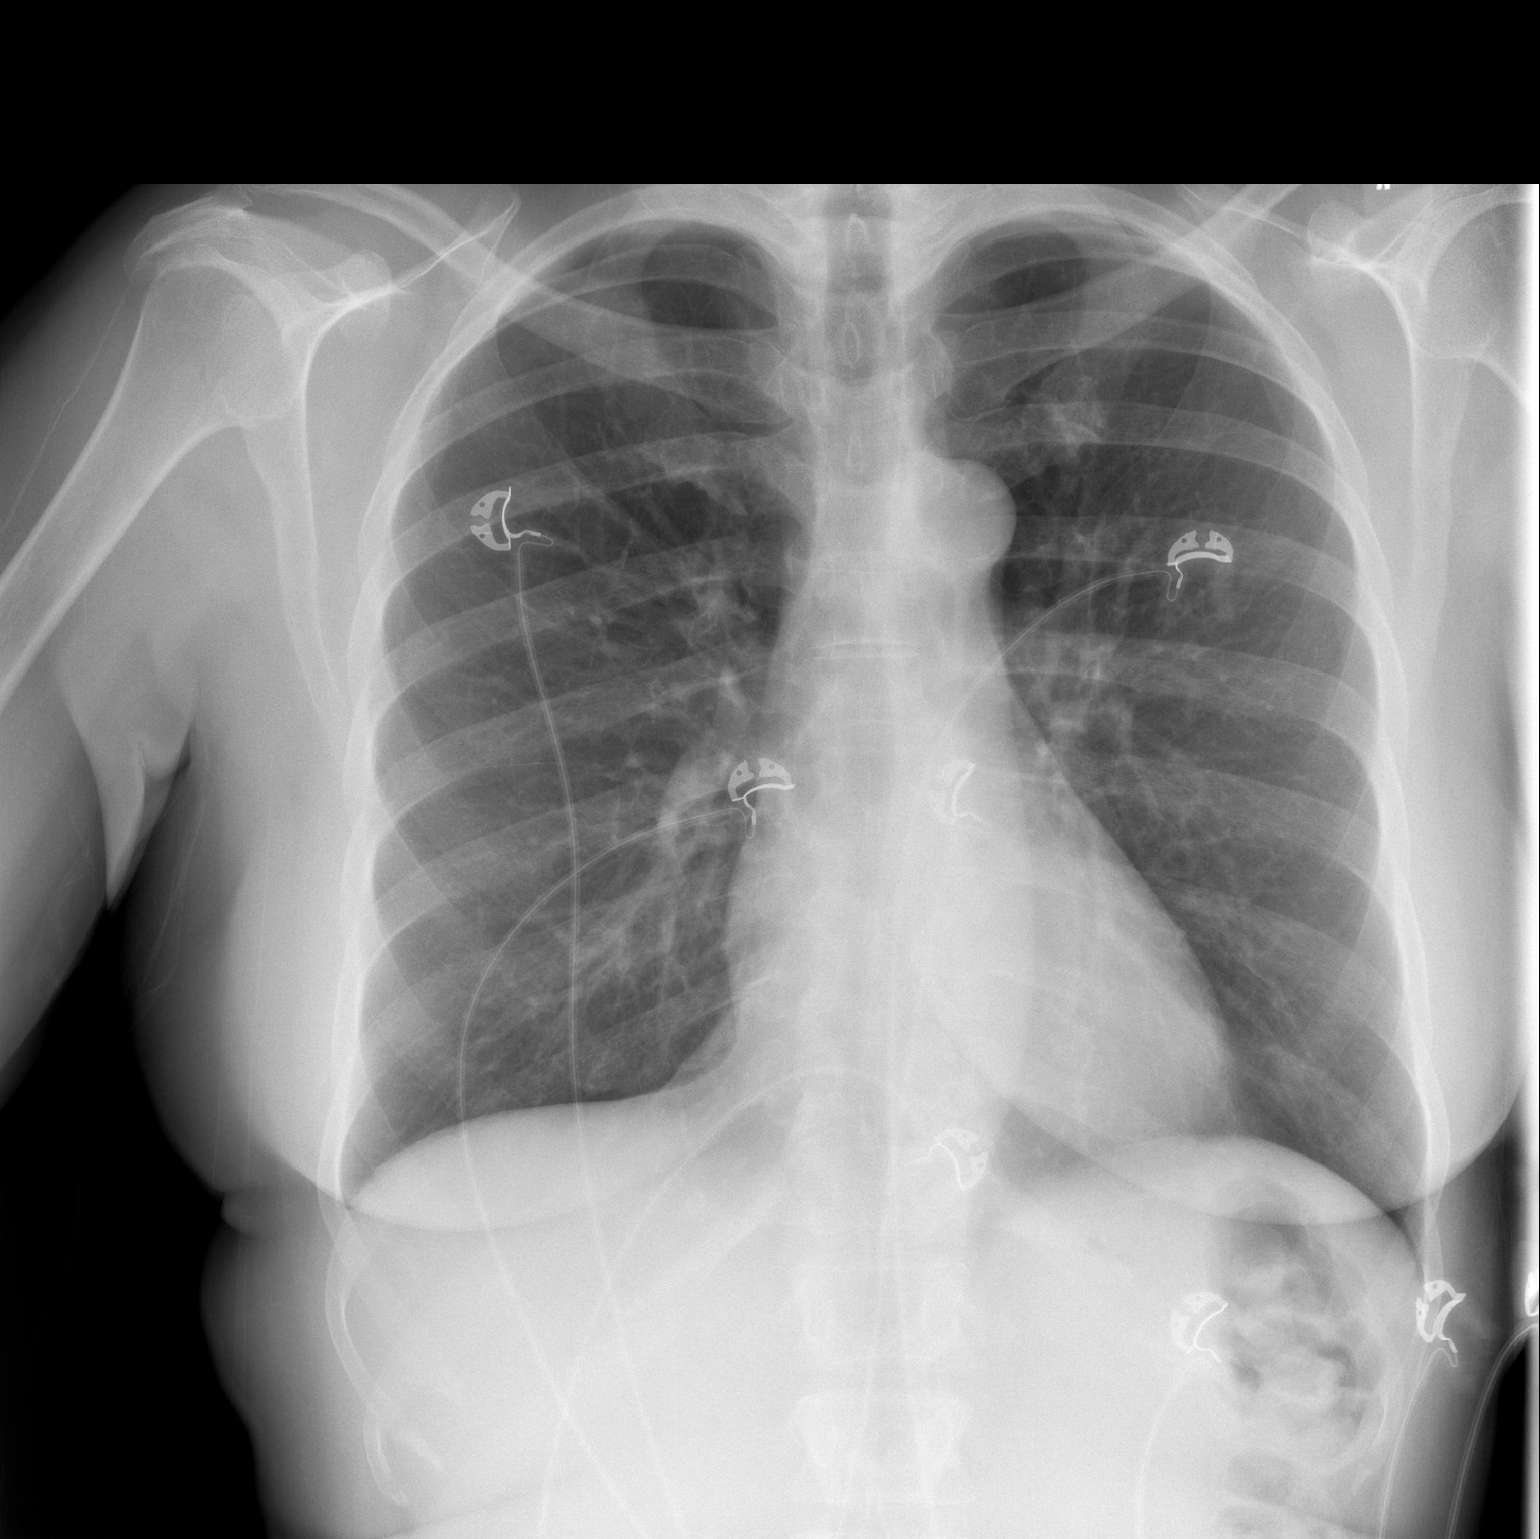

[w chest lat]
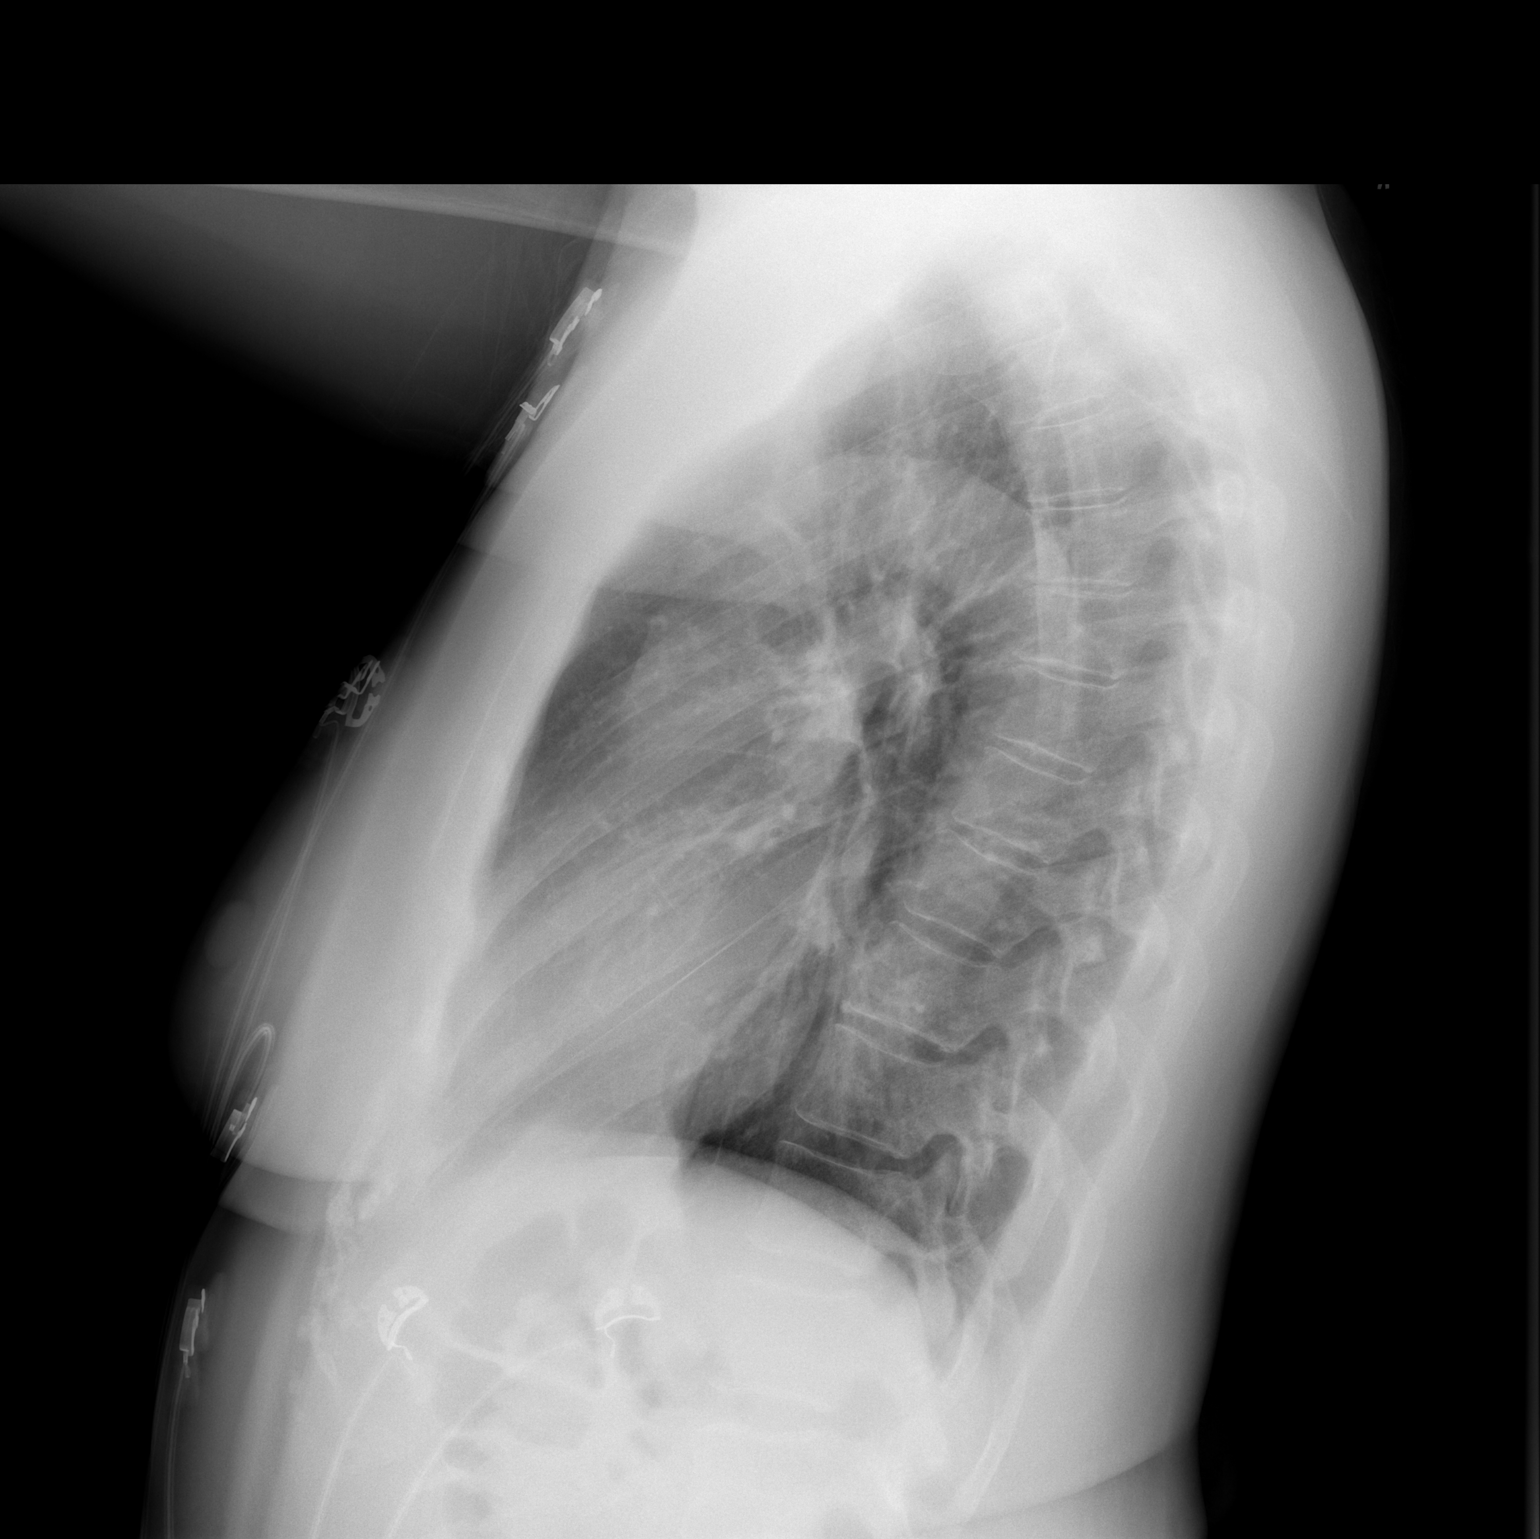

[2 of 2 positions shown; findings below may reference images not displayed]

FINDINGS: The heart size and mediastinal contours are within normal limits.
Both lungs are clear. The visualized skeletal structures are
unremarkable. Aortic calcifications are noted.
IMPRESSION: No active cardiopulmonary disease.

## 2021-06-02 ENCOUNTER — Other Ambulatory Visit: Payer: Self-pay

## 2021-06-02 ENCOUNTER — Encounter (HOSPITAL_BASED_OUTPATIENT_CLINIC_OR_DEPARTMENT_OTHER): Payer: Self-pay | Admitting: *Deleted

## 2021-06-02 ENCOUNTER — Emergency Department (HOSPITAL_BASED_OUTPATIENT_CLINIC_OR_DEPARTMENT_OTHER)
Admission: EM | Admit: 2021-06-02 | Discharge: 2021-06-02 | Disposition: A | Discharge status: Other Acute Inpatient Hospital | Payer: Medicaid Other | Attending: Emergency Medicine | Admitting: Emergency Medicine

## 2021-06-02 DIAGNOSIS — Z79899 Other long term (current) drug therapy: Secondary | ICD-10-CM | POA: Insufficient documentation

## 2021-06-02 DIAGNOSIS — E119 Type 2 diabetes mellitus without complications: Secondary | ICD-10-CM | POA: Insufficient documentation

## 2021-06-02 DIAGNOSIS — H5712 Ocular pain, left eye: Secondary | ICD-10-CM | POA: Insufficient documentation

## 2021-06-02 DIAGNOSIS — Z794 Long term (current) use of insulin: Secondary | ICD-10-CM | POA: Diagnosis not present

## 2021-06-02 MED ORDER — TETRACAINE HCL 0.5 % OP SOLN
2.0000 [drp] | Freq: Once | OPHTHALMIC | Status: AC
  Administered 2021-06-02: 22:00:00 2 [drp] via OPHTHALMIC
  Filled 2021-06-02: qty 4

## 2021-06-02 MED ORDER — FLUORESCEIN SODIUM 1 MG OP STRP
1.0000 | ORAL_STRIP | Freq: Once | OPHTHALMIC | Status: AC
  Administered 2021-06-02: 22:00:00 1 via OPHTHALMIC
  Filled 2021-06-02: qty 1

## 2021-06-02 NOTE — ED Triage Notes (Signed)
Swelling, painful and tearing left eye x 1 week. Hx of glaucoma.

## 2021-06-02 NOTE — Discharge Instructions (Addendum)
Like we discussed, please go straight to the [facility] Emergency Department in [place].  When you check in at the front desk please ask for Dr. Yolonda Kida with ophthalmology.

## 2021-06-02 NOTE — ED Notes (Signed)
Slit lamp, woods lamp, tono pen to bedside per MD request.

## 2021-06-02 NOTE — ED Provider Notes (Signed)
[facility] EMERGENCY DEPARTMENT Provider Note   CSN: [id number] Arrival date & time: 06/02/21  2051     History Chief Complaint  Patient presents with   Eye Problem    Tracy Colon is a 60 y.o. female.  HPI Patient is a 60 year old female with a history of diabetes mellitus, glaucoma, who presents to the emergency department due to left eye pain, redness, and increased lacrimation.  Patient states the pain has been waxing and waning for the past 3 weeks.  States that began worsening this afternoon.  Reports redness in the eye.  No fevers, chills, nausea, vomiting.  Patient states that her vision is 20/200 in the right eye and she has no vision in the left eye.  Per records, patient currently on brimonidine drops, dorzolamide-timolol drops, prednisolone acetate drops, tetrahydrozoline/polyethylene glycol drops.    Past Medical History:  Diagnosis Date   Diabetes mellitus without complication (HCC)    Glaucoma    Neuropathy    Thyroid disease    Vertigo     There are no problems to display for this patient.   Past Surgical History:  Procedure Laterality Date   CATARACT EXTRACTION     THYROIDECTOMY     TONSILLECTOMY     TUBAL LIGATION       OB History   No obstetric history on file.     No family history on file.  Social History   Tobacco Use   Smoking status: Never   Smokeless tobacco: Never  Vaping Use   Vaping Use: Never used  Substance Use Topics   Alcohol use: No   Drug use: No    Home Medications Prior to Admission medications   Medication Sig Start Date End Date Taking? Authorizing Provider  acetaZOLAMIDE (DIAMOX SEQUELS) 500 MG capsule Take 1 capsule (500 mg total) by mouth 2 (two) times daily. 05/06/19   Tegeler, Canary Brim, MD  amoxicillin-clavulanate (AUGMENTIN) 875-125 MG tablet Take 1 tablet by mouth 2 (two) times daily. 09/15/20   [provider]  brimonidine (ALPHAGAN) 0.2 % ophthalmic solution Place 1 drop into  the right eye 2 (two) times daily. 06/17/20   [provider]  dorzolamide-timolol (COSOPT) 22.3-6.8 MG/ML ophthalmic solution 1 drop 2 (two) times daily. 06/29/20   [provider]  gabapentin (NEURONTIN) 100 MG capsule Take 3 capsules (300 mg total) by mouth 3 (three) times daily. 10/17/19 11/16/19  Melene Plan, DO  hydrochlorothiazide (HYDRODIURIL) 25 MG tablet Take 1 tablet (25 mg total) by mouth daily. 09/22/20   Charlynne Pander, MD  hydrOXYzine (ATARAX/VISTARIL) 25 MG tablet Take 1 tablet (25 mg total) by mouth every 6 (six) hours. 07/14/17   Rolan Bucco, MD  insulin aspart (NOVOLOG) 100 UNIT/ML injection Inject into the skin 3 (three) times daily before meals.    [provider]  insulin detemir (LEVEMIR) 100 UNIT/ML injection Inject 40 Units into the skin daily.     [provider]  insulin glargine (LANTUS) 100 UNIT/ML injection Inject 40 Units into the skin daily.    [provider]  losartan (COZAAR) 50 MG tablet Take 1 tablet by mouth daily. 02/12/20   [provider]  oxyCODONE-acetaminophen (PERCOCET/ROXICET) 5-325 MG tablet Take 1 tablet by mouth every 4 (four) hours as needed. 05/06/19   Tegeler, Canary Brim, MD  predniSONE (DELTASONE) 20 MG tablet 1 tabs po daily x 4 days 07/14/17   Rolan Bucco, MD  terbinafine (LAMISIL AT) 1 % cream Apply 1 application topically 2 (  two) times daily. 07/12/16   Garlon Hatchet, PA-C    Allergies    Naprosyn [naproxen]  Review of Systems   Review of Systems  All other systems reviewed and are negative. Ten systems reviewed and are negative for acute change, except as noted in the HPI.   Physical Exam Updated Vital Signs BP (!) 190/96    Pulse 85    Temp 98.2 F (36.8 C) (Oral)    Resp 18    Ht 5\' 9"  (1.753 m)    Wt 78.5 kg    SpO2 100%    BMI 25.56 kg/m   Physical Exam Vitals and nursing note reviewed.  Constitutional:      General: She is not in acute distress.    Appearance: Normal  appearance. She is not ill-appearing, toxic-appearing or diaphoretic.  HENT:     Head: Normocephalic and atraumatic.     Right Ear: External ear normal.     Left Ear: External ear normal.     Nose: Nose normal.     Mouth/Throat:     Mouth: Mucous membranes are moist.     Pharynx: Oropharynx is clear. No oropharyngeal exudate or posterior oropharyngeal erythema.  Eyes:     Comments: Left eye appears injected.  Increased lacrimation.  Left pupil is oval in shape and not reactive to light.  Cornea is hazy.  IOP measured multiple times with the Tono-Pen with 1 reading of 50 mmHg and the other readings "unreadable".  No increased fluorescein uptake.  Right sclera is clear.  Pupil there is reactive to light.  Extraocular movement intact.  IOP 30 mmHg.  Cardiovascular:     Rate and Rhythm: Normal rate and regular rhythm.     Pulses: Normal pulses.     Heart sounds: Normal heart sounds. No murmur heard.   No friction rub. No gallop.  Pulmonary:     Effort: Pulmonary effort is normal. No respiratory distress.     Breath sounds: Normal breath sounds. No stridor. No wheezing, rhonchi or rales.  Abdominal:     General: Abdomen is flat.     Palpations: Abdomen is soft.     Tenderness: There is no abdominal tenderness.  Musculoskeletal:        General: Normal range of motion.     Cervical back: Normal range of motion and neck supple. No tenderness.  Skin:    General: Skin is warm and dry.  Neurological:     General: No focal deficit present.     Mental Status: She is alert and oriented to person, place, and time.  Psychiatric:        Mood and Affect: Mood normal.        Behavior: Behavior normal.   ED Results / Procedures / Treatments   Labs (all labs ordered are listed, but only abnormal results are displayed) Labs Reviewed - No data to display  EKG None  Radiology No results found.  Procedures Procedures   Medications Ordered in ED Medications  tetracaine (PONTOCAINE) 0.5 %  ophthalmic solution 2 drop (2 drops Left Eye Given 06/02/21 2206)  fluorescein ophthalmic strip 1 strip (1 strip Left Eye Given 06/02/21 2206)    ED Course  I have reviewed the triage vital signs and the nursing notes.  Pertinent labs & imaging results that were available during my care of the patient were reviewed by me and considered in my medical decision making (see chart for details).  Clinical Course as of 06/02/21 2258  Thu Jun 02, 2021  2252 Patient discussed with Dr. Yolonda Kida with ophthalmology with [facility].  Discussed possible outpatient treatment versus patient being transferred to [facility].  She would prefer to transfer ED to ED tonight and be treated.  Patient will be driven via POV by her son.  Feel that this is reasonable. [LJ]    Clinical Course User Index [LJ] Placido Sou, PA-C   MDM Rules/Calculators/A&P                          Patient is a 60 year old female with a complex medical history with the left eye including glaucoma and blindness who presents to the emergency department due to left eye pain, redness, as well as increased lacrimation.  On my exam left eye is injected.  Was able to get a single IOP of 50 mmHg but otherwise it was unreadable.  Right eye with a pressure of 30 mmHg.  No increased fluorescein uptake.  Patient discussed with Dr. Yolonda Kida with ophthalmology at [facility], as patient is followed at [facility].  We discussed outpatient management versus transfer ED to ED for evaluation and possible treatment tonight.  Patient elects to be transferred ED to ED.  Her son is at bedside and is going to drive her via POV.  Feel that this is reasonable and patient is stable for transfer at this time. EMTALA has been completed and [facility] was contacted by our staff for transfer.  Final Clinical Impression(s) / ED Diagnoses Final diagnoses:  Left eye pain   Rx / DC Orders ED Discharge Orders      None        Placido Sou, PA-C 06/02/21 2302    Tegeler, Canary Brim, MD 06/02/21 [phone]

## 2024-07-04 ENCOUNTER — Other Ambulatory Visit: Payer: Self-pay

## 2024-07-04 DIAGNOSIS — N186 End stage renal disease: Secondary | ICD-10-CM

## 2024-07-29 ENCOUNTER — Ambulatory Visit (HOSPITAL_COMMUNITY): Admitting: Vascular Surgery

## 2024-07-29 ENCOUNTER — Ambulatory Visit (HOSPITAL_COMMUNITY)
